# Patient Record
Sex: Male | Born: 1975
Health system: Southern US, Community
[De-identification: ages and names within clinical notes are randomized; demographics above are authoritative.]

## PROBLEM LIST (undated history)

## (undated) DIAGNOSIS — E669 Obesity, unspecified: Secondary | ICD-10-CM

## (undated) DIAGNOSIS — E291 Testicular hypofunction: Secondary | ICD-10-CM

## (undated) DIAGNOSIS — T7840XA Allergy, unspecified, initial encounter: Secondary | ICD-10-CM

## (undated) DIAGNOSIS — N529 Male erectile dysfunction, unspecified: Secondary | ICD-10-CM

## (undated) DIAGNOSIS — K219 Gastro-esophageal reflux disease without esophagitis: Secondary | ICD-10-CM

## (undated) DIAGNOSIS — M779 Enthesopathy, unspecified: Secondary | ICD-10-CM

## (undated) DIAGNOSIS — E876 Hypokalemia: Secondary | ICD-10-CM

## (undated) DIAGNOSIS — M1990 Unspecified osteoarthritis, unspecified site: Secondary | ICD-10-CM

## (undated) DIAGNOSIS — Z87891 Personal history of nicotine dependence: Secondary | ICD-10-CM

## (undated) DIAGNOSIS — G709 Myoneural disorder, unspecified: Secondary | ICD-10-CM

## (undated) DIAGNOSIS — E079 Disorder of thyroid, unspecified: Secondary | ICD-10-CM

## (undated) DIAGNOSIS — F419 Anxiety disorder, unspecified: Secondary | ICD-10-CM

## (undated) DIAGNOSIS — J45909 Unspecified asthma, uncomplicated: Secondary | ICD-10-CM

## (undated) HISTORY — DX: Unspecified osteoarthritis, unspecified site: M19.90

## (undated) HISTORY — DX: Testicular hypofunction: E29.1

## (undated) HISTORY — PX: REPLACEMENT TOTAL KNEE: SUR1224

## (undated) HISTORY — DX: Unspecified asthma, uncomplicated: J45.909

## (undated) HISTORY — PX: WISDOM TOOTH EXTRACTION: SHX21

## (undated) HISTORY — DX: Male erectile dysfunction, unspecified: N52.9

## (undated) HISTORY — PX: KNEE ARTHROSCOPY: SUR90

## (undated) HISTORY — DX: Enthesopathy, unspecified: M77.9

## (undated) HISTORY — DX: Disorder of thyroid, unspecified: E07.9

## (undated) HISTORY — DX: Myoneural disorder, unspecified: G70.9

## (undated) HISTORY — DX: Personal history of nicotine dependence: Z87.891

## (undated) HISTORY — PX: CERVICAL FUSION: SHX112

## (undated) HISTORY — DX: Gastro-esophageal reflux disease without esophagitis: K21.9

## (undated) HISTORY — DX: Obesity, unspecified: E66.9

## (undated) HISTORY — DX: Allergy, unspecified, initial encounter: T78.40XA

## (undated) HISTORY — PX: OSTEOTOMY MAXILLARY: SUR982

## (undated) HISTORY — PX: OTHER SURGICAL HISTORY: SHX169

## (undated) HISTORY — DX: Anxiety disorder, unspecified: F41.9

## (undated) HISTORY — DX: Hypokalemia: E87.6

## (undated) NOTE — Procedures (Signed)
 Associated Order(s): NERVE CONDUCTION STUDIES, 3-4 STUDIES Procedure(s): NERVE CONDUCTION STUDIES, 3-4 STUDIES Pre-Procedure Diagnose(s): PERIPHERAL NEUROPATHY Post-Procedure Diagnose(s): PERIPHERAL NEUROPATHY Formatting of this note might be different from the original. See notes Electronically signed by Raynaldo Evalene Mt (M.D.), M.D. at 08/29/2016  7:57 AM PST

## (undated) NOTE — Progress Notes (Signed)
 Formatting of this note might be different from the original. History: NEUROLOGY OUTPATIENT CONSULTATION  Consultation Requested by: Veronica Oliva Purchase (M.D.)   Chief Complaint: foot pain  TRAVELLE MCCLIMANS is a 59 year old right handed male developed severe bilateral foot pain circa 2009. Onset was fairly abrupt. Severe pain both feet, aggravated by standing. Told that he had a polyneuropathy. Mother also diagnosed with polyneuropathy. severe pain on plantar feet eventually resolved, but feet still burn, although largely relieved by Lyrica  75 mg BID to TID. Intolerant of gabapentin (at dose of 3600 mg daily). Treated for depression with SSRIs. Complains of erectile dysfunction; Viagra helps, but decreased libido. Lost 100 pounds with dieting and intense exercise. Denies weakness - in fact, he is a competitive weight lifter, denies orthostastic lightheadedness. Bowel and bladder function normal. No loss of sensation in perineum. Denies Lhermitte sign. Currently no sciatica symptoms, lower back pain or symptoms of neurogenic claudication. Status post 3 lumbar epidural steroid injections 2012 for sciatica that affected the left lower extremity. He has not had actual sensory loss in the feet. The patient has no history of diabetes mellitus.  Outpatient Prescriptions Marked as Taking for the 08/28/16 encounter (Procedure Only) with Raynaldo, Evalene Mt (M.D.), M.D.: ALPRAZolam (XANAX) 1 mg Oral Tab Pregabalin  (LYRICA ) 75 mg Oral Cap tiZANidine (ZANAFLEX) 4 mg Oral Tab Naproxen (NAPROSYN) 500 mg Oral Tab FLUoxetine (PROZAC) 40 mg Oral Cap traZODone (DESYREL) 50 mg Oral Tab Potassium Chloride (KLOR-CON 8) 8 mEq Oral SR Tab Cetirizine  (ZYRTEC ) 10 mg Oral Tab Levothyroxine  (LEVOTHROID/SYNTHROID ) 25 mcg Oral Tab    EXAM:  BP 132/67   Pulse 65   Ht 1.778 m (5' 10)   Wt 146.5 kg (322 lb 15.6 oz)   BMI 46.34 kg/m   Appears Healthy in NAD Alert, Oriented X 3  Straight leg raising  negative bilaterally   Pedal pulses intact  Tinel's sign absent at both tarsal tunnels  There is normal motor tone, bulk and strength throughout  Temperature sensation was decreased on the toes of both feet; touch and pin prick sensation were normal. Joint position sensation was normal at the great toes. Vibratory sensation was slightly decreased at the ankles.  The ankle jerks are absent bilaterally. Muscle stretch reflexes are otherwise normal and symmetrical throughout   Balance and gait are normal     History Reviewed:  I have reviewed the Medical/Surgical, Family history as displayed in [facility] on the date of the encounter or the portion(s) as noted in the progress note.        ELECTRODIAGNOSTIC TESTING:  NERVE CONDUCTION STUDIES - Lower Extremity  Sensory:  Nerve  Site/distance  Amplitude (mcV)  Latency (msec)  R sural Ankle 14 cm   NR (> 0)   NR  (< 4.5)  R sup per Ankle 14 cm   6 (> 0)   4.0  (< 4.5)  Motor  Nerve  Site/distance       Amp (mV)  Lat (msec) CV (m/sec)   R peroneal Ankle 9 cm    10.2  (>2.0) 4.6 (< 6.5)    Below fib     9.5    48  (>40)   R tibial Ankle 10 cm    13.0  (> 2.8) 4.1 (< 6.0)    popliteal     12.5    49   (>40)  Interpretation: normal nerve conduction studies; a small fiber polyneuropathy cannot be excluded  ASSESSMENT:  PERIPHERAL NEUROPATHY  (primary  encounter diagnosis) Note: non-progressive symptoms. Possible Guillain Barre' Syndrome variant. Bilateral L5/S1 radiculopathies could have a similar appearance. Complete work up. Continue low dose Lyrica  for pain. I doubt that the polyneuropathy is a significant contributing factor to the symptoms of erectile dysfunction. More likely depression and side effects from SSRIs. Patient has been advised to call for results.. Follow up in 6 months  Plan: VITAMIN B12 (COBALAMIN)           RHEUMATOID FACTOR, SERUM           IMMUNOGLOBULINS G A M           ANCA TITER AND PATTERN            IMMUNOFIXATION ELECTROPHORESIS, SERUM.           ANA           SS-A AND SS-B ANTIBODIES           BORRELIA BURGDORFERI ANTIBODY           METHYLMALONIC ACID  Electronically signed by: EVALENE MT ARMSTRONG MD 08/29/2016 7:55 AM      Electronically signed by Raynaldo Evalene Mt (M.D.), M.D. at 08/29/2016  7:57 AM PST

## (undated) NOTE — Nursing Note (Signed)
 Formatting of this note might be different from the original. Care Gaps  Update BMI - Take Height AND Weight  Electronically signed by Lenon Channel, M.A. at 08/28/2016  3:12 PM PST

---

## 2000-08-14 ENCOUNTER — Encounter: Admission: RE | Admit: 2000-08-14 | Discharge: 2000-08-14 | Payer: Self-pay | Admitting: Internal Medicine

## 2000-08-14 ENCOUNTER — Encounter: Payer: Self-pay | Admitting: Internal Medicine

## 2017-10-01 ENCOUNTER — Ambulatory Visit (INDEPENDENT_AMBULATORY_CARE_PROVIDER_SITE_OTHER): Payer: Managed Care, Other (non HMO) | Admitting: Physician Assistant

## 2017-10-01 ENCOUNTER — Encounter: Payer: Self-pay | Admitting: Physician Assistant

## 2017-10-01 ENCOUNTER — Encounter (INDEPENDENT_AMBULATORY_CARE_PROVIDER_SITE_OTHER): Payer: Self-pay

## 2017-10-01 ENCOUNTER — Ambulatory Visit: Payer: Self-pay | Admitting: Physician Assistant

## 2017-10-01 VITALS — BP 115/77 | HR 66 | Ht 70.0 in | Wt 293.0 lb

## 2017-10-01 DIAGNOSIS — E291 Testicular hypofunction: Secondary | ICD-10-CM

## 2017-10-01 DIAGNOSIS — E039 Hypothyroidism, unspecified: Secondary | ICD-10-CM | POA: Insufficient documentation

## 2017-10-01 DIAGNOSIS — M778 Other enthesopathies, not elsewhere classified: Secondary | ICD-10-CM

## 2017-10-01 DIAGNOSIS — E038 Other specified hypothyroidism: Secondary | ICD-10-CM

## 2017-10-01 DIAGNOSIS — M779 Enthesopathy, unspecified: Secondary | ICD-10-CM | POA: Diagnosis not present

## 2017-10-01 DIAGNOSIS — G609 Hereditary and idiopathic neuropathy, unspecified: Secondary | ICD-10-CM | POA: Insufficient documentation

## 2017-10-01 DIAGNOSIS — Z7689 Persons encountering health services in other specified circumstances: Secondary | ICD-10-CM

## 2017-10-01 DIAGNOSIS — E876 Hypokalemia: Secondary | ICD-10-CM | POA: Insufficient documentation

## 2017-10-01 DIAGNOSIS — M67922 Unspecified disorder of synovium and tendon, left upper arm: Secondary | ICD-10-CM | POA: Insufficient documentation

## 2017-10-01 DIAGNOSIS — M19022 Primary osteoarthritis, left elbow: Secondary | ICD-10-CM | POA: Insufficient documentation

## 2017-10-01 MED ORDER — PREGABALIN 75 MG PO CAPS: 75.0000 mg | ORAL_CAPSULE | Freq: Three times a day (TID) | ORAL | 0 refills | Status: DC | PRN

## 2017-10-01 NOTE — Progress Notes (Signed)
HPI:                                                                Garrett Watson is a 42 y.o. male who presents to [place]: [place] today to establish care  Current concerns include: medication refills, elbow pain  Hypothyroidism: diagnosed in 2010 with subclinical hypothyroidism. Takes synthroid 25 mcg daily without issues. Denies chest pain, heart palpitations, tremor, GI symptoms, or skin changes.  Elbow pain: Left lateral elbow pain for years. Worse with external rotation. Has intermittent numbness in his 4th and 5th digits. Takes Aleve prn and wears a tennis elbow strap.   Peripheral neuropathy: reports idiopathic neuropathy of both feet beginning in 2012, diagnosed by Dr. Tasia Catchings, Neurologist at [facility]. Reports intermittent peripheral edema and sensory differences in his lower extremities. He has labs with him on his phone today showing negative ANA, RF, negative pituitary MRI, negative immunofixation, normal B12, normal cortisol, normal LH, normal A1c   Hypogonadism: 1 reading <200, 1 reading low 300's. Has never been on testosterone replacement.  No flowsheet data found.  No flowsheet data found.      Past Medical History:  Diagnosis Date  . Hypokalemia   . Thyroid disease    Past Surgical History:  Procedure Laterality Date  . KNEE ARTHROSCOPY Right    2002, 2018  . OSTEOTOMY MAXILLARY     Social History   Tobacco Use  . Smoking status: Former Smoker    Types: Cigarettes    Last attempt to quit: 08/12/2015    Years since quitting: 2.1  . Smokeless tobacco: Never Used  Substance Use Topics  . Alcohol use: Never    Frequency: Never   family history includes Alcohol abuse in his brother, brother, and father; Depression in his cousin; Diabetes in his cousin, paternal aunt, and paternal uncle; Hyperlipidemia in his father; Hypertension in his father; Kidney cancer in his maternal grandmother; Lung cancer in his  maternal grandfather; Stroke in his father and paternal aunt.    ROS: negative except as noted in the HPI  Medications: Current Outpatient Medications  Medication Sig Dispense Refill  . B Complex Vitamins (B COMPLEX 100 PO) Take by mouth.    . fexofenadine (ALLEGRA) 180 MG tablet Take 180 mg by mouth daily.    Nyoka Cowden Tea 150 MG CAPS Take by mouth.    . Lactobacillus-Inulin ([place] PO) Take by mouth.    . levothyroxine (SYNTHROID, LEVOTHROID) 25 MCG tablet Take 1 tablet (25 mcg total) by mouth daily before breakfast. 90 tablet 1  . Multiple Vitamins-Minerals (CENTRUM ADULTS PO) Take by mouth.    . NONFORMULARY OR COMPOUNDED ITEM B-NOX Pre-workout formula    . Omega-3 Fatty Acids (FISH OIL) 1000 MG CAPS Take by mouth.    . pregabalin (LYRICA) 75 MG capsule Take 1 capsule (75 mg total) by mouth 3 (three) times daily as needed. 90 capsule 0   No current facility-administered medications for this visit.    Allergies  Allergen Reactions  . Robitussin 12 Hour Cough [Dextromethorphan Polistirex Er]        Objective:  BP 115/77   Pulse 66   Ht 5\' 10"  (1.778 m)   Wt 293 lb (132.9 kg)  BMI 42.04 kg/m  Gen:  alert, not ill-appearing, no distress, appropriate for age, obese male HEENT: head normocephalic without obvious abnormality, conjunctiva and cornea clear, trachea midline Pulm: Normal work of breathing, normal phonation, clear to auscultation bilaterally, no wheezes, rales or rhonchi CV: Normal rate, regular rhythm, s1 and s2 distinct, no murmurs, clicks or rubs  Neuro: alert and oriented x 3, no tremor MSK: left elbow atraumatic, wearing an elbow strap, no tenderness over the medial or lateral epicondyles, tenderness over the extensor tendons, ROM and strength intact Skin: intact, no rashes on exposed skin, no jaundice, no cyanosis Psych: well-groomed, cooperative, good eye contact, euthymic mood, affect mood-congruent, speech is articulate, and thought  processes clear and goal-directed   No results found.    Assessment and Plan: 42 y.o. male with   1. Encounter to establish care - Personally reviewed PMH, PSH, PFH, medications, allergies, HM - Age-appropriate cancer screening: n/a - Influenza n/a - Tdap declined today   2. Hypokalemia due to inadequate potassium intake - Comprehensive metabolic panel  3. Subclinical hypothyroidism - doing well on Synthroid 25 mcg - TSH + free T4  4. Idiopathic peripheral neuropathy - patient will send records from Neurologist at [facility] [place] [facility]. I reviewed his history with Dr. Dianah Field and he will follow-up with Sports Medicine for this. Refilled Lyrica, coupon card provided.  6. Forearm tendonitis - rehab exercises, declines prescription NSAID, cont Aleve prn. Follow-up with Sports Medicine  Encounter to establish care  Hypokalemia due to inadequate potassium intake - Plan: Comprehensive metabolic panel  Subclinical hypothyroidism - Plan: TSH + free T4, levothyroxine (SYNTHROID, LEVOTHROID) 25 MCG tablet  Idiopathic peripheral neuropathy - Plan: pregabalin (LYRICA) 75 MG capsule  Hypercalcemia - Plan: PTH, intact and calcium  Forearm tendonitis  Secondary male hypogonadism     Patient education and anticipatory guidance given Patient agrees with treatment plan Follow-up in 2 weeks with sports medicine for peripheral neuropathy and tendonitis or sooner as needed if symptoms worsen or fail to improve  Darlyne Russian PA-C

## 2017-10-02 LAB — COMPREHENSIVE METABOLIC PANEL
AG Ratio: 1.9 (calc) (ref 1.0–2.5)
ALT: 19 U/L (ref 9–46)
AST: 21 U/L (ref 10–40)
Albumin: 4.8 g/dL (ref 3.6–5.1)
Alkaline phosphatase (APISO): 46 U/L (ref 40–115)
BUN: 24 mg/dL (ref 7–25)
CHLORIDE: 103 mmol/L (ref 98–110)
CO2: 31 mmol/L (ref 20–32)
Calcium: 10.4 mg/dL — ABNORMAL HIGH (ref 8.6–10.3)
Creat: 1.04 mg/dL (ref 0.60–1.35)
GLOBULIN: 2.5 g/dL (ref 1.9–3.7)
GLUCOSE: 78 mg/dL (ref 65–139)
Potassium: 4.1 mmol/L (ref 3.5–5.3)
SODIUM: 140 mmol/L (ref 135–146)
TOTAL PROTEIN: 7.3 g/dL (ref 6.1–8.1)
Total Bilirubin: 1 mg/dL (ref 0.2–1.2)

## 2017-10-02 LAB — TSH+FREE T4: TSH W/REFLEX TO FT4: 2.4 mIU/L (ref 0.40–4.50)

## 2017-10-03 ENCOUNTER — Encounter: Payer: Self-pay | Admitting: Physician Assistant

## 2017-10-03 DIAGNOSIS — M779 Enthesopathy, unspecified: Secondary | ICD-10-CM

## 2017-10-03 DIAGNOSIS — E291 Testicular hypofunction: Secondary | ICD-10-CM | POA: Insufficient documentation

## 2017-10-03 DIAGNOSIS — M778 Other enthesopathies, not elsewhere classified: Secondary | ICD-10-CM | POA: Insufficient documentation

## 2017-10-03 MED ORDER — LEVOTHYROXINE SODIUM 25 MCG PO TABS: 25.0000 ug | ORAL_TABLET | Freq: Every day | ORAL | 1 refills | Status: DC

## 2017-10-03 NOTE — Progress Notes (Signed)
Potassium is normal. Do not recommend oral potassium due to risk of hyperkalemia and potentially fatal heart arrhythmias. Continue working on adequate dietary potassium. Calcium is mildly increased. Recheck in 1 month

## 2017-10-15 ENCOUNTER — Ambulatory Visit: Payer: Managed Care, Other (non HMO) | Admitting: Sports Medicine

## 2017-10-15 DIAGNOSIS — Z0189 Encounter for other specified special examinations: Secondary | ICD-10-CM

## 2017-10-18 ENCOUNTER — Ambulatory Visit (INDEPENDENT_AMBULATORY_CARE_PROVIDER_SITE_OTHER): Payer: Managed Care, Other (non HMO) | Admitting: Sports Medicine

## 2017-10-18 ENCOUNTER — Encounter: Payer: Self-pay | Admitting: Sports Medicine

## 2017-10-18 DIAGNOSIS — G609 Hereditary and idiopathic neuropathy, unspecified: Secondary | ICD-10-CM | POA: Diagnosis not present

## 2017-10-18 DIAGNOSIS — M67922 Unspecified disorder of synovium and tendon, left upper arm: Secondary | ICD-10-CM

## 2017-10-18 NOTE — Assessment & Plan Note (Signed)
NSAIDs as needed, rehab exercises given, if no improvement in 2 to 3 weeks we will do a distal biceps injection.

## 2017-10-18 NOTE — Assessment & Plan Note (Signed)
Normal lab work-up, rheumatologic work-up. Nerve conduction/EMG shows small fiber polyneuropathy versus radiculopathy. Does have a history of lumbar degenerative disc disease, we need an MRI. He has already had x-rays in [place] that were unremarkable. Continue Lyrica for now.

## 2017-10-18 NOTE — Progress Notes (Signed)
Subjective:    I'm seeing this patient as a consultation for: Nelson Chimes, PA-C  CC: Peripheral neuropathy, left elbow pain  HPI: Left elbow pain: Present for a year now, localized over the anterolateral aspect of the left elbow, no radiation, worse with gripping motions.  Patient suspected this was a tennis elbow, purchased a standard tennis elbow brace, has not had much improvement in symptoms.  Neuropathy: Has had a fairly extensive work-up in [place], including a full serum work-up with vitamin B12 levels, A1c, ANA, rheumatoid testing, testing for multiple myeloma.  All of which was negative, he had a nerve conduction study/EMG, this showed possible small nerve polyneuropathy, versus lumbar radiculopathy, otherwise normal.  His symptoms were initially controlled with gabapentin which she had difficulty tolerating, he was switched to Lyrica which seems to control his symptoms well.  He does have a history of lumbar degenerative disc disease and had been getting epidural sometime prior.  Does not have much back pain now, but more paresthesias to the lateral feet.  I reviewed the past medical history, family history, social history, surgical history, and allergies today and no changes were needed.  Please see the problem list section below in epic for further details.  Past Medical History: Past Medical History:  Diagnosis Date  . Hypokalemia   . Thyroid disease    Past Surgical History: Past Surgical History:  Procedure Laterality Date  . KNEE ARTHROSCOPY Right    2002, 2018  . OSTEOTOMY MAXILLARY     Social History: Social History   Socioeconomic History  . Marital status: Single    Spouse name: Not on file  . Number of children: Not on file  . Years of education: Not on file  . Highest education level: Not on file  Occupational History  . Not on file  Social Needs  . Financial resource strain: Not on file  . Food insecurity:    Worry: Not on file    Inability:  Not on file  . Transportation needs:    Medical: Not on file    Non-medical: Not on file  Tobacco Use  . Smoking status: Former Smoker    Types: Cigarettes    Last attempt to quit: 08/12/2015    Years since quitting: 2.1  . Smokeless tobacco: Never Used  Substance and Sexual Activity  . Alcohol use: Never    Frequency: Never  . Drug use: Yes    Types: Marijuana  . Sexual activity: Not Currently  Lifestyle  . Physical activity:    Days per week: Not on file    Minutes per session: Not on file  . Stress: Not on file  Relationships  . Social connections:    Talks on phone: Not on file    Gets together: Not on file    Attends religious service: Not on file    Active member of club or organization: Not on file    Attends meetings of clubs or organizations: Not on file    Relationship status: Not on file  Other Topics Concern  . Not on file  Social History Narrative  . Not on file   Family History: Family History  Problem Relation Age of Onset  . Alcohol abuse Father   . Hyperlipidemia Father   . Hypertension Father   . Stroke Father   . Alcohol abuse Brother   . Stroke Paternal Aunt   . Diabetes Paternal Aunt   . Diabetes Paternal Uncle   . Kidney cancer  Maternal Grandmother   . Lung cancer Maternal Grandfather   . Alcohol abuse Brother   . Diabetes Cousin   . Depression Cousin    Allergies: Allergies  Allergen Reactions  . Robitussin 12 Hour Cough [Dextromethorphan Polistirex Er]    Medications: See med rec.  Review of Systems: No headache, visual changes, nausea, vomiting, diarrhea, constipation, dizziness, abdominal pain, skin rash, fevers, chills, night sweats, weight loss, swollen lymph nodes, body aches, joint swelling, muscle aches, chest pain, shortness of breath, mood changes, visual or auditory hallucinations.   Objective:   General: Well Developed, well nourished, and in no acute distress.  Neuro:  Extra-ocular muscles intact, able to move all 4  extremities, sensation grossly intact.  Deep tendon reflexes tested were normal. Psych: Alert and oriented, mood congruent with affect. ENT:  Ears and nose appear unremarkable.  Hearing grossly normal. Neck: Unremarkable overall appearance, trachea midline.  No visible thyroid enlargement. Eyes: Conjunctivae and lids appear unremarkable.  Pupils equal and round. Skin: Warm and dry, no rashes noted.  Cardiovascular: Pulses palpable, no extremity edema. Back Exam:  Inspection: Unremarkable  Motion: Flexion 45 deg, Extension 45 deg, Side Bending to 45 deg bilaterally,  Rotation to 45 deg bilaterally  SLR laying: Negative  XSLR laying: Negative  Palpable tenderness: None. FABER: negative. Sensory change: Gross sensation intact to all lumbar and sacral dermatomes.  Reflexes: 2+ at both patellar tendons, 0 at achilles tendons, Babinski's downgoing.  Strength at foot  Plantar-flexion: 5/5 Dorsi-flexion: 5/5 Eversion: 5/5 Inversion: 5/5  Leg strength  Quad: 5/5 Hamstring: 5/5 Hip flexor: 5/5 Hip abductors: 5/5  Gait unremarkable. Left elbow: Unremarkable to inspection. Range of motion full pronation, supination, flexion, extension. Strength is full to all of the above directions Stable to varus, valgus stress. Negative moving valgus stress test. Only mild palpable pain over the anterior proximal radius, reproduction of pain with resisted supination of the forearm. Ulnar nerve does not sublux. Negative cubital tunnel Tinel's.  Impression and Recommendations:   This case required medical decision making of moderate complexity.  Left distal biceps tendinopathy NSAIDs as needed, rehab exercises given, if no improvement in 2 to 3 weeks we will do a distal biceps injection.  Idiopathic peripheral neuropathy Normal lab work-up, rheumatologic work-up. Nerve conduction/EMG shows small fiber polyneuropathy versus radiculopathy. Does have a history of lumbar degenerative disc disease, we need an  MRI. He has already had x-rays in [place] that were unremarkable. Continue Lyrica for now. ___________________________________________ Gwen Her. Dianah Field, M.D., ABFM., CAQSM. Primary Care and [place] Instructor of [place] [facility]

## 2017-11-11 ENCOUNTER — Telehealth: Payer: Self-pay | Admitting: Physician Assistant

## 2017-11-11 DIAGNOSIS — M5136 Other intervertebral disc degeneration, lumbar region: Secondary | ICD-10-CM

## 2017-11-11 DIAGNOSIS — M51369 Other intervertebral disc degeneration, lumbar region without mention of lumbar back pain or lower extremity pain: Secondary | ICD-10-CM

## 2017-11-11 NOTE — Telephone Encounter (Signed)
Pt called to see what the next steps were since his MRI was denied. Do not see where we have any xrays on file. Will route.

## 2017-11-12 NOTE — Telephone Encounter (Signed)
Do you have record of the xrays? They had requested clinical information and there was no imaging to attach to notes aside from mentioning their report in last OV note.

## 2017-11-12 NOTE — Telephone Encounter (Signed)
No unfortunately this is based off of his report.  I'll place the orders for xrays, have him come in for that and then maybe we try to resubmit?

## 2017-11-12 NOTE — Addendum Note (Signed)
Addended by: Silverio Decamp on: 11/12/2017 01:25 PM   Modules accepted: Orders

## 2017-11-12 NOTE — Telephone Encounter (Signed)
Pt advised. He will get repeat xrays next week then we can resubmit.

## 2017-11-12 NOTE — Telephone Encounter (Signed)
He already had x-rays in [place] that were unremarkable, I am not sure why they are denying the MRI.  May be try to resubmit it with the diagnosis of lumbar degenerative disc disease?  He had x-rays and has failed physical therapy, steroids, NSAIDs.

## 2017-11-17 ENCOUNTER — Other Ambulatory Visit: Payer: Self-pay | Admitting: *Deleted

## 2017-11-17 DIAGNOSIS — G609 Hereditary and idiopathic neuropathy, unspecified: Secondary | ICD-10-CM

## 2017-11-17 MED ORDER — PREGABALIN 75 MG PO CAPS: 75.0000 mg | ORAL_CAPSULE | Freq: Three times a day (TID) | ORAL | 0 refills | Status: DC | PRN

## 2017-11-22 ENCOUNTER — Ambulatory Visit: Payer: Managed Care, Other (non HMO) | Admitting: Sports Medicine

## 2017-11-25 ENCOUNTER — Ambulatory Visit (INDEPENDENT_AMBULATORY_CARE_PROVIDER_SITE_OTHER): Payer: Managed Care, Other (non HMO)

## 2017-11-25 ENCOUNTER — Ambulatory Visit (INDEPENDENT_AMBULATORY_CARE_PROVIDER_SITE_OTHER): Payer: Managed Care, Other (non HMO) | Admitting: Sports Medicine

## 2017-11-25 ENCOUNTER — Encounter: Payer: Self-pay | Admitting: Sports Medicine

## 2017-11-25 DIAGNOSIS — M5116 Intervertebral disc disorders with radiculopathy, lumbar region: Secondary | ICD-10-CM | POA: Diagnosis not present

## 2017-11-25 DIAGNOSIS — G609 Hereditary and idiopathic neuropathy, unspecified: Secondary | ICD-10-CM | POA: Diagnosis not present

## 2017-11-25 DIAGNOSIS — M67922 Unspecified disorder of synovium and tendon, left upper arm: Secondary | ICD-10-CM

## 2017-11-25 DIAGNOSIS — M5136 Other intervertebral disc degeneration, lumbar region: Secondary | ICD-10-CM

## 2017-11-25 NOTE — Assessment & Plan Note (Signed)
Normal lab work-up, rheumatologic work-up, nerve conduction/EMG shows small fiber polyneuropathy versus radiculopathy. Does have a history of lumbar degenerative disc disease, MRI was initially denied by the insurance company. Recently did x-rays today, L5 on S1 retrolisthesis. Continue Lyrica, resubmitting for the MRI. Please use a diagnosis of radiculopathy.

## 2017-11-25 NOTE — Assessment & Plan Note (Signed)
Persistent pain in spite of conservative measures. Distal biceps peritendinous injection, return to see me in 1 month for this.

## 2017-11-25 NOTE — Progress Notes (Signed)
Subjective:    CC: Elbow pain  HPI: Izaias returns, we treated him for a left bicipital tendinitis distal, last visit, he has done his rehab exercises, NSAIDs, activity modification without any improvement in his pain that continues to be localized over the anterior aspect of his elbow at the proximal radius.  Worsened with flexion and supination activities.  In addition we have been working to diagnose his lower extremity paresthesias, nerve conduction study showed possible radiculopathy, we initially ordered an MRI this was denied, he obtained his x-ray today, symptoms are persistent.  I reviewed the past medical history, family history, social history, surgical history, and allergies today and no changes were needed.  Please see the problem list section below in epic for further details.  Past Medical History: Past Medical History:  Diagnosis Date  . Hypokalemia   . Thyroid disease    Past Surgical History: Past Surgical History:  Procedure Laterality Date  . KNEE ARTHROSCOPY Right    2002, 2018  . OSTEOTOMY MAXILLARY     Social History: Social History   Socioeconomic History  . Marital status: Single    Spouse name: Not on file  . Number of children: Not on file  . Years of education: Not on file  . Highest education level: Not on file  Occupational History  . Not on file  Social Needs  . Financial resource strain: Not on file  . Food insecurity:    Worry: Not on file    Inability: Not on file  . Transportation needs:    Medical: Not on file    Non-medical: Not on file  Tobacco Use  . Smoking status: Former Smoker    Types: Cigarettes    Last attempt to quit: 08/12/2015    Years since quitting: 2.2  . Smokeless tobacco: Never Used  Substance and Sexual Activity  . Alcohol use: Never    Frequency: Never  . Drug use: Yes    Types: Marijuana  . Sexual activity: Not Currently  Lifestyle  . Physical activity:    Days per week: Not on file    Minutes per  session: Not on file  . Stress: Not on file  Relationships  . Social connections:    Talks on phone: Not on file    Gets together: Not on file    Attends religious service: Not on file    Active member of club or organization: Not on file    Attends meetings of clubs or organizations: Not on file    Relationship status: Not on file  Other Topics Concern  . Not on file  Social History Narrative  . Not on file   Family History: Family History  Problem Relation Age of Onset  . Alcohol abuse Father   . Hyperlipidemia Father   . Hypertension Father   . Stroke Father   . Alcohol abuse Brother   . Stroke Paternal Aunt   . Diabetes Paternal Aunt   . Diabetes Paternal Uncle   . Kidney cancer Maternal Grandmother   . Lung cancer Maternal Grandfather   . Alcohol abuse Brother   . Diabetes Cousin   . Depression Cousin    Allergies: Allergies  Allergen Reactions  . Robitussin 12 Hour Cough [Dextromethorphan Polistirex Er]    Medications: See med rec.  Review of Systems: No fevers, chills, night sweats, weight loss, chest pain, or shortness of breath.   Objective:    General: Well Developed, well nourished, and in no acute distress.  Neuro: Alert and oriented x3, extra-ocular muscles intact, sensation grossly intact.  HEENT: Normocephalic, atraumatic, pupils equal round reactive to light, neck supple, no masses, no lymphadenopathy, thyroid nonpalpable.  Skin: Warm and dry, no rashes. Cardiac: Regular rate and rhythm, no murmurs rubs or gallops, no lower extremity edema.  Respiratory: Clear to auscultation bilaterally. Not using accessory muscles, speaking in full sentences. Left Elbow: Unremarkable to inspection. Range of motion full pronation, supination, flexion, extension. Strength is full to all of the above directions Stable to varus, valgus stress. Negative moving valgus stress test. Tender to palpation over the anterior proximal radius, reproduction of pain with  resisted supination of the forearm Ulnar nerve does not sublux. Negative cubital tunnel Tinel's.  Procedure: Real-time Ultrasound Guided Injection of left distal biceps tendon Device: GE Logiq E  Verbal informed consent obtained.  Time-out conducted.  Noted no overlying erythema, induration, or other signs of local infection.  Skin prepped in a sterile fashion.  Local anesthesia: Topical Ethyl chloride.  With sterile technique and under real time ultrasound guidance: The elbow was brought to flexion and the forearm in pronation, I visualized the biceps insertion from a dorsal approach at the radial tuberosity, I then advanced a 25-gauge needle to the biceps insertion and injected medication superficial to the tendon, taking care to avoid intratendinous injection, we used a total of 1 cc kenalog 40, 1 cc lidocaine, 1 cc bupivacaine. Completed without difficulty  Pain immediately resolved suggesting accurate placement of the medication.  Advised to call if fevers/chills, erythema, induration, drainage, or persistent bleeding.  Images permanently stored and available for review in the ultrasound unit.  Impression: Technically successful ultrasound guided injection.  Lumbar spine x-rays personally reviewed, there is retrolisthesis of L5 on S1.  Impression and Recommendations:    Left distal biceps tendinopathy Persistent pain in spite of conservative measures. Distal biceps peritendinous injection, return to see me in 1 month for this.  Idiopathic peripheral neuropathy Normal lab work-up, rheumatologic work-up, nerve conduction/EMG shows small fiber polyneuropathy versus radiculopathy. Does have a history of lumbar degenerative disc disease, MRI was initially denied by the insurance company. Recently did x-rays today, L5 on S1 retrolisthesis. Continue Lyrica, resubmitting for the MRI. Please use a diagnosis of radiculopathy.  ___________________________________________ Gwen Her.  Dianah Field, M.D., ABFM., CAQSM. Primary Care and [place] Instructor of [place] [facility]

## 2017-12-27 ENCOUNTER — Ambulatory Visit (INDEPENDENT_AMBULATORY_CARE_PROVIDER_SITE_OTHER): Payer: Managed Care, Other (non HMO) | Admitting: Sports Medicine

## 2017-12-27 ENCOUNTER — Ambulatory Visit (INDEPENDENT_AMBULATORY_CARE_PROVIDER_SITE_OTHER): Payer: Managed Care, Other (non HMO)

## 2017-12-27 ENCOUNTER — Encounter: Payer: Self-pay | Admitting: Sports Medicine

## 2017-12-27 DIAGNOSIS — M19022 Primary osteoarthritis, left elbow: Secondary | ICD-10-CM

## 2017-12-27 DIAGNOSIS — M67922 Unspecified disorder of synovium and tendon, left upper arm: Secondary | ICD-10-CM

## 2017-12-27 DIAGNOSIS — G609 Hereditary and idiopathic neuropathy, unspecified: Secondary | ICD-10-CM | POA: Diagnosis not present

## 2017-12-27 NOTE — Assessment & Plan Note (Signed)
Normal lab work-up, normal rheumatologic work-up, nerve conduction/EMG shows small fiber polyneuropathy versus radiculopathy. More recent MRI showed L4-L5 degenerative disc disease with spinal stenosis, proceeding with a bilateral L4-L5 transforaminal epidural. Continue Lyrica, return to see me 1 month after injection. He will go and get an MRI burned to a disc before his injection.

## 2017-12-27 NOTE — Assessment & Plan Note (Signed)
80% improvement with distal biceps injection. Persistent pain in the posterior elbow, worse with hyperextension concerning for intra-articular process. At this point we are going to proceed with x-ray and an MRI, he has failed greater than 6 weeks of physician directed conservative measures including physical therapy and injections.

## 2017-12-27 NOTE — Progress Notes (Signed)
Subjective:    CC: Recheck elbow  HPI: Garrett Watson returns, we did a left distal biceps tendon sheath injection at the last visit, he had about 80% improvement in pain with regards to his anterolateral elbow, still has some pain in the posterior elbow.  At this point he is failed greater than 6 weeks of physician directed conservative measures including NSAIDs, therapy, injections.  In addition we have been working up his leg paresthesias, MRI was recently done after a nerve conduction study showed small fiber polyneuropathy versus radiculopathy.  I reviewed the past medical history, family history, social history, surgical history, and allergies today and no changes were needed.  Please see the problem list section below in epic for further details.  Past Medical History: Past Medical History:  Diagnosis Date  . Hypokalemia   . Thyroid disease    Past Surgical History: Past Surgical History:  Procedure Laterality Date  . KNEE ARTHROSCOPY Right    2002, 2018  . OSTEOTOMY MAXILLARY     Social History: Social History   Socioeconomic History  . Marital status: Single    Spouse name: Not on file  . Number of children: Not on file  . Years of education: Not on file  . Highest education level: Not on file  Occupational History  . Not on file  Social Needs  . Financial resource strain: Not on file  . Food insecurity:    Worry: Not on file    Inability: Not on file  . Transportation needs:    Medical: Not on file    Non-medical: Not on file  Tobacco Use  . Smoking status: Former Smoker    Types: Cigarettes    Last attempt to quit: 08/12/2015    Years since quitting: 2.3  . Smokeless tobacco: Never Used  Substance and Sexual Activity  . Alcohol use: Never    Frequency: Never  . Drug use: Yes    Types: Marijuana  . Sexual activity: Not Currently  Lifestyle  . Physical activity:    Days per week: Not on file    Minutes per session: Not on file  . Stress: Not on file    Relationships  . Social connections:    Talks on phone: Not on file    Gets together: Not on file    Attends religious service: Not on file    Active member of club or organization: Not on file    Attends meetings of clubs or organizations: Not on file    Relationship status: Not on file  Other Topics Concern  . Not on file  Social History Narrative  . Not on file   Family History: Family History  Problem Relation Age of Onset  . Alcohol abuse Father   . Hyperlipidemia Father   . Hypertension Father   . Stroke Father   . Alcohol abuse Brother   . Stroke Paternal Aunt   . Diabetes Paternal Aunt   . Diabetes Paternal Uncle   . Kidney cancer Maternal Grandmother   . Lung cancer Maternal Grandfather   . Alcohol abuse Brother   . Diabetes Cousin   . Depression Cousin    Allergies: Allergies  Allergen Reactions  . Robitussin 12 Hour Cough [Dextromethorphan Polistirex Er]    Medications: See med rec.  Review of Systems: No fevers, chills, night sweats, weight loss, chest pain, or shortness of breath.   Objective:    General: Well Developed, well nourished, and in no acute distress.  Neuro: Alert  and oriented x3, extra-ocular muscles intact, sensation grossly intact.  HEENT: Normocephalic, atraumatic, pupils equal round reactive to light, neck supple, no masses, no lymphadenopathy, thyroid nonpalpable.  Skin: Warm and dry, no rashes. Cardiac: Regular rate and rhythm, no murmurs rubs or gallops, no lower extremity edema.  Respiratory: Clear to auscultation bilaterally. Not using accessory muscles, speaking in full sentences. Left elbow: Unremarkable to inspection. Range of motion full pronation, supination, flexion, extension, pain with terminal extension. Strength is full to all of the above directions Stable to varus, valgus stress. Negative moving valgus stress test. No discrete areas of tenderness to palpation. Ulnar nerve does not sublux. Negative cubital tunnel  Tinel's.  MRI shows multifactorial central canal stenosis at L4-L5 with mild bilateral foraminal stenosis at this level.  Impression and Recommendations:    Left distal biceps tendinopathy 80% improvement with distal biceps injection. Persistent pain in the posterior elbow, worse with hyperextension concerning for intra-articular process. At this point we are going to proceed with x-ray and an MRI, he has failed greater than 6 weeks of physician directed conservative measures including physical therapy and injections. ___________________________________________ Gwen Her. Dianah Field, M.D., ABFM., CAQSM. Primary Care and [place] Instructor of [place] [facility]

## 2018-01-05 ENCOUNTER — Other Ambulatory Visit: Payer: Self-pay

## 2018-01-05 DIAGNOSIS — G609 Hereditary and idiopathic neuropathy, unspecified: Secondary | ICD-10-CM

## 2018-01-05 MED ORDER — PREGABALIN 75 MG PO CAPS: 75.0000 mg | ORAL_CAPSULE | Freq: Three times a day (TID) | ORAL | 0 refills | Status: DC | PRN

## 2018-01-24 ENCOUNTER — Other Ambulatory Visit: Payer: Self-pay | Admitting: Sports Medicine

## 2018-01-24 ENCOUNTER — Ambulatory Visit
Admission: RE | Admit: 2018-01-24 | Discharge: 2018-01-24 | Disposition: A | Discharge status: Home / Self Care | Payer: Managed Care, Other (non HMO) | Source: Ambulatory Visit | Attending: Sports Medicine | Admitting: Sports Medicine

## 2018-01-24 DIAGNOSIS — G609 Hereditary and idiopathic neuropathy, unspecified: Secondary | ICD-10-CM

## 2018-01-24 MED ORDER — IOPAMIDOL (ISOVUE-M 200) INJECTION 41%
1.0000 mL | Freq: Once | INTRAMUSCULAR | Status: AC
  Administered 2018-01-24: 1 mL via EPIDURAL

## 2018-01-24 MED ORDER — METHYLPREDNISOLONE ACETATE 40 MG/ML INJ SUSP (RADIOLOG
120.0000 mg | Freq: Once | INTRAMUSCULAR | Status: AC
  Administered 2018-01-24: 120 mg via EPIDURAL

## 2018-01-24 NOTE — Discharge Instructions (Signed)

## 2018-02-24 ENCOUNTER — Other Ambulatory Visit: Payer: Self-pay

## 2018-02-24 DIAGNOSIS — G609 Hereditary and idiopathic neuropathy, unspecified: Secondary | ICD-10-CM

## 2018-02-24 MED ORDER — PREGABALIN 75 MG PO CAPS: 75.0000 mg | ORAL_CAPSULE | Freq: Three times a day (TID) | ORAL | 0 refills | Status: DC | PRN

## 2018-04-04 ENCOUNTER — Other Ambulatory Visit: Payer: Self-pay | Admitting: Physician Assistant

## 2018-04-04 DIAGNOSIS — G609 Hereditary and idiopathic neuropathy, unspecified: Secondary | ICD-10-CM

## 2018-04-15 ENCOUNTER — Other Ambulatory Visit: Payer: Self-pay | Admitting: Physician Assistant

## 2018-04-15 DIAGNOSIS — E038 Other specified hypothyroidism: Secondary | ICD-10-CM

## 2018-04-15 DIAGNOSIS — E039 Hypothyroidism, unspecified: Secondary | ICD-10-CM

## 2018-05-09 ENCOUNTER — Other Ambulatory Visit: Payer: Self-pay | Admitting: Physician Assistant

## 2018-05-09 DIAGNOSIS — G609 Hereditary and idiopathic neuropathy, unspecified: Secondary | ICD-10-CM

## 2018-06-16 ENCOUNTER — Other Ambulatory Visit: Payer: Self-pay | Admitting: Physician Assistant

## 2018-06-16 DIAGNOSIS — G609 Hereditary and idiopathic neuropathy, unspecified: Secondary | ICD-10-CM

## 2018-06-16 NOTE — Telephone Encounter (Signed)
Needs to have labs drawn and follow-up appt Has not seen me in the office since April He is also seeing Dr. Darene Lamer, but since I am filling the Lyrica, I need to see him myself at least every 6 months

## 2018-06-30 ENCOUNTER — Other Ambulatory Visit: Payer: Self-pay | Admitting: Physician Assistant

## 2018-06-30 DIAGNOSIS — G609 Hereditary and idiopathic neuropathy, unspecified: Secondary | ICD-10-CM

## 2018-07-05 ENCOUNTER — Encounter: Payer: Self-pay | Admitting: Physician Assistant

## 2018-07-05 ENCOUNTER — Ambulatory Visit (INDEPENDENT_AMBULATORY_CARE_PROVIDER_SITE_OTHER): Payer: BLUE CROSS/BLUE SHIELD | Admitting: Physician Assistant

## 2018-07-05 VITALS — BP 123/82 | HR 87 | Resp 16 | Wt 330.0 lb

## 2018-07-05 DIAGNOSIS — G609 Hereditary and idiopathic neuropathy, unspecified: Secondary | ICD-10-CM | POA: Diagnosis not present

## 2018-07-05 DIAGNOSIS — Z6841 Body Mass Index (BMI) 40.0 and over, adult: Secondary | ICD-10-CM

## 2018-07-05 DIAGNOSIS — Z13220 Encounter for screening for lipoid disorders: Secondary | ICD-10-CM

## 2018-07-05 DIAGNOSIS — E038 Other specified hypothyroidism: Secondary | ICD-10-CM

## 2018-07-05 DIAGNOSIS — J3089 Other allergic rhinitis: Secondary | ICD-10-CM

## 2018-07-05 DIAGNOSIS — E039 Hypothyroidism, unspecified: Secondary | ICD-10-CM

## 2018-07-05 DIAGNOSIS — R635 Abnormal weight gain: Secondary | ICD-10-CM | POA: Diagnosis not present

## 2018-07-05 DIAGNOSIS — E291 Testicular hypofunction: Secondary | ICD-10-CM

## 2018-07-05 MED ORDER — PREGABALIN 75 MG PO CAPS: 75.0000 mg | ORAL_CAPSULE | Freq: Three times a day (TID) | ORAL | 2 refills | Status: DC

## 2018-07-05 MED ORDER — MONTELUKAST SODIUM 10 MG PO TABS: 10.0000 mg | ORAL_TABLET | Freq: Every day | ORAL | 3 refills | Status: DC

## 2018-07-05 MED ORDER — FLUTICASONE PROPIONATE 50 MCG/ACT NA SUSP: 1.0000 | Freq: Every day | NASAL | 3 refills | Status: DC

## 2018-07-05 NOTE — Progress Notes (Signed)
HPI:                                                                Garrett Watson is a 43 y.o. male who presents to [facility] today for multiple concerns  Sinus issues: reports he is having monthly cold-like symptoms. He feels like he is sick every 4-6 weeks since July 2019.  He has been seen at outside urgent cares and had several prescriptions for Z-Paks over this time.  Reports antibiotics were not particularly helpful for symptoms. Taking Dayquil and Nyquil on a regular basis. He has a mild deviated septum, but no prior history of chronic or recurrent sinusitis. Has not had any symptoms for the last 2 weeks.  Endorses chronic passive smoke exposure at work for 10 hours/day. He quit smoking in 2017, reports he smoked approx 15 packyears. No fever, chills, nightsweats.  Denies cough, wheezing, dyspnea. Endorses history of environmental allergies.  Weight gain: has gained over 30 pounds since last OV 6 months ago.  Current weight is 330 pounds.  Highest adult weight was 405 pounds.  His typical adult weight is around 290 to 300 pounds. Has noticed increased fat in his abdominal area. States he tracks his food daily and eats 2700/day on work-out days and 2200 on non-work out day. Goes to the gym for 5 days per week, doing a mix of weight and cardio for 60 minutes.  Now at a job where he walks 12,000 steps per day, since November 2019. Prior weight loss attempts: traditional diet and exercise has been able to lose upwards of 100 lb, states he has always had difficulty losing weight and has to work very hard Comorbidities: subclinical hypothyroidism - has been on 25 mcg for 10-12 years; hypogonadism not currently on testosterone replacement, last testosterone levels in 2018 were between 179 and 327 (Care Everywhere)  He denies symptoms of hypogonadism including low libido, decreased energy, change in strength or endurance, loss of height,  mood changes, falling asleep after dinner.    Past Medical History:  Diagnosis Date  . Allergy   . Erectile dysfunction   . Former cigarette smoker   . Hypogonadism male   . Hypokalemia   . Obesity   . Thyroid disease    Past Surgical History:  Procedure Laterality Date  . KNEE ARTHROSCOPY Right    2002, 2018  . OSTEOTOMY MAXILLARY     Social History   Tobacco Use  . Smoking status: Former Smoker    Types: Cigarettes    Last attempt to quit: 08/12/2015    Years since quitting: 2.8  . Smokeless tobacco: Never Used  Substance Use Topics  . Alcohol use: Never    Frequency: Never   family history includes Alcohol abuse in his brother, brother, and father; Depression in his cousin; Diabetes in his cousin, paternal aunt, and paternal uncle; Hyperlipidemia in his father; Hypertension in his father; Kidney cancer in his maternal grandmother; Lung cancer in his maternal grandfather; Stroke in his father and paternal aunt.    ROS: negative except as noted in the HPI  Medications: Current Outpatient Medications  Medication Sig Dispense Refill  . B Complex Vitamins (B COMPLEX 100 PO) Take by mouth.    Marland Kitchen  Cetirizine HCl (ZYRTEC PO) Take by mouth.    Nyoka Cowden Tea 150 MG CAPS Take by mouth.    . Lactobacillus-Inulin ([place] PO) Take by mouth.    . levothyroxine (SYNTHROID, LEVOTHROID) 25 MCG tablet TAKE 1 TABLET DAILY BEFORE BREAKFAST 90 tablet 1  . Multiple Vitamins-Minerals (CENTRUM ADULTS PO) Take by mouth.    . NONFORMULARY OR COMPOUNDED ITEM B-NOX Pre-workout formula    . Omega-3 Fatty Acids (FISH OIL) 1000 MG CAPS Take by mouth.    . pregabalin (LYRICA) 75 MG capsule Take 1 capsule (75 mg total) by mouth 3 (three) times daily. 90 capsule 2  . fluticasone (FLONASE) 50 MCG/ACT nasal spray Place 1 spray into both nostrils daily. 16 g 3  . montelukast (SINGULAIR) 10 MG tablet Take 1 tablet (10 mg total) by mouth at bedtime. 90 tablet 3   No current  facility-administered medications for this visit.    Allergies  Allergen Reactions  . Robitussin 12 Hour Cough [Dextromethorphan Polistirex Er]        Objective:  BP 123/82   Pulse 87   Resp 16   Wt (!) 330 lb (149.7 kg)   SpO2 97%   BMI 47.35 kg/m  Gen:  alert, not ill-appearing, no distress, appropriate for age, obese male HEENT: head normocephalic without obvious abnormality, conjunctiva and cornea clear, deviated nasal septum, nares patent, no frontal or maxillary sinus tenderness, oropharynx with white postnasal secretions, tonsils between grade 2 and 3 with mild redness, no exudates, uvula midline, neck supple, no cervical adenopathy trachea midline Pulm: Normal work of breathing, normal phonation, clear to auscultation bilaterally, no wheezes, rales or rhonchi CV: Normal rate, regular rhythm, s1 and s2 distinct, no murmurs, clicks or rubs  Neuro: alert and oriented x 3, no tremor MSK: extremities atraumatic, normal gait and station, trace peripheral edema bilaterally Skin: intact, no rashes on exposed skin, no jaundice, no cyanosis Psych: well-groomed, cooperative, good eye contact, euthymic mood, affect mood-congruent, speech is articulate, and thought processes clear and goal-directed  Wt Readings from Last 3 Encounters:  07/05/18 (!) 330 lb (149.7 kg)  12/27/17 298 lb (135.2 kg)  11/25/17 (!) 302 lb (137 kg)      No results found for this or any previous visit (from the past 72 hour(s)). No results found.    Assessment and Plan: 43 y.o. male with   .Diagnoses and all orders for this visit:  Abnormal weight gain -     Hemoglobin A1c -     COMPLETE METABOLIC PANEL WITH GFR -     TSH + free T4  Idiopathic peripheral neuropathy -     pregabalin (LYRICA) 75 MG capsule; Take 1 capsule (75 mg total) by mouth 3 (three) times daily.  Subclinical hypothyroidism -     TSH + free T4  Hypercalcemia -     PTH, Intact and Calcium  Class 3 severe obesity due to  excess calories with serious comorbidity and body mass index (BMI) of 45.0 to 49.9 in adult [facility])  Secondary male hypogonadism -     Testosterone  Non-seasonal allergic rhinitis, unspecified trigger -     montelukast (SINGULAIR) 10 MG tablet; Take 1 tablet (10 mg total) by mouth at bedtime. -     fluticasone (FLONASE) 50 MCG/ACT nasal spray; Place 1 spray into both nostrils daily.  Screening for lipid disorders -     Lipid Panel w/reflex Direct LDL     Recurrent cold symptoms I believe are  actually due to environmental allergies based on his history and physical exam today.  Treating with antihistamine, leukotriene antagonist, intranasal corticosteroid.  Class III obesity Checking labs today including A1c, TSH, CMP, lipids due to 32 pound weight gain.  Also rechecking testosterone level. Based on his self-reported caloric intake he should be losing between 1 and 2 pounds per week Would recommend keeping calorie count to no more than 2500/day on days that he exercises.  It is possible he is adding back more than he is burning at the gym since estimates of calories burned are not typically accurate. Provided with information on pharmacologic options and instructed to contact his insurance company regarding coverage for weight loss medication.   Patient education and anticipatory guidance given Patient agrees with treatment plan Follow-up in 6 weeks or sooner as needed if symptoms worsen or fail to improve  Darlyne Russian PA-C

## 2018-07-06 LAB — COMPLETE METABOLIC PANEL WITH GFR
AG RATIO: 1.7 (calc) (ref 1.0–2.5)
ALBUMIN MSPROF: 4.3 g/dL (ref 3.6–5.1)
ALKALINE PHOSPHATASE (APISO): 44 U/L (ref 40–115)
ALT: 20 U/L (ref 9–46)
AST: 22 U/L (ref 10–40)
BILIRUBIN TOTAL: 0.7 mg/dL (ref 0.2–1.2)
BUN: 16 mg/dL (ref 7–25)
CHLORIDE: 103 mmol/L (ref 98–110)
CO2: 26 mmol/L (ref 20–32)
Calcium: 9.8 mg/dL (ref 8.6–10.3)
Creat: 0.89 mg/dL (ref 0.60–1.35)
GFR, Est African American: 123 mL/min/{1.73_m2} (ref >=60)
GFR, Est Non African American: 106 mL/min/{1.73_m2} (ref >=60)
GLOBULIN: 2.6 g/dL (ref 1.9–3.7)
GLUCOSE: 87 mg/dL (ref 65–99)
POTASSIUM: 4.3 mmol/L (ref 3.5–5.3)
SODIUM: 140 mmol/L (ref 135–146)
Total Protein: 6.9 g/dL (ref 6.1–8.1)

## 2018-07-06 LAB — HEMOGLOBIN A1C
Hgb A1c MFr Bld: 5.2 % of total Hgb (ref <5.7)
MEAN PLASMA GLUCOSE: 103 (calc)
eAG (mmol/L): 5.7 (calc)

## 2018-07-06 LAB — TSH+FREE T4: TSH W/REFLEX TO FT4: 1.74 mIU/L (ref 0.40–4.50)

## 2018-07-06 LAB — TESTOSTERONE: Testosterone: 231 ng/dL — ABNORMAL LOW (ref 250–827)

## 2018-07-06 LAB — LIPID PANEL W/REFLEX DIRECT LDL
Cholesterol: 197 mg/dL (ref <200)
HDL: 43 mg/dL (ref >40)
LDL Cholesterol (Calc): 134 mg/dL (calc) — ABNORMAL HIGH
Non-HDL Cholesterol (Calc): 154 mg/dL (calc) — ABNORMAL HIGH (ref <130)
Total CHOL/HDL Ratio: 4.6 (calc) (ref <5.0)
Triglycerides: 102 mg/dL (ref <150)

## 2018-07-06 LAB — PTH, INTACT AND CALCIUM
Calcium: 9.8 mg/dL (ref 8.6–10.3)
PTH: 15 pg/mL (ref 14–64)

## 2018-07-14 ENCOUNTER — Telehealth: Payer: Self-pay

## 2018-07-14 ENCOUNTER — Other Ambulatory Visit: Payer: Self-pay

## 2018-07-14 DIAGNOSIS — E038 Other specified hypothyroidism: Secondary | ICD-10-CM

## 2018-07-14 DIAGNOSIS — E66813 Obesity, class 3: Secondary | ICD-10-CM

## 2018-07-14 DIAGNOSIS — R635 Abnormal weight gain: Secondary | ICD-10-CM

## 2018-07-14 DIAGNOSIS — E039 Hypothyroidism, unspecified: Secondary | ICD-10-CM

## 2018-07-14 DIAGNOSIS — Z6841 Body Mass Index (BMI) 40.0 and over, adult: Principal | ICD-10-CM

## 2018-07-14 MED ORDER — LEVOTHYROXINE SODIUM 25 MCG PO TABS: 25.0000 ug | ORAL_TABLET | Freq: Every day | ORAL | 0 refills | Status: DC

## 2018-07-14 NOTE — Telephone Encounter (Signed)
Pt left vm stating that he would like to try Qsymia.  He is wanting this sent to [facility]. -EH/RMA

## 2018-07-15 MED ORDER — PHENTERMINE-TOPIRAMATE ER 7.5-46 MG PO CP24: 1.0000 | ORAL_CAPSULE | Freq: Every morning | ORAL | 0 refills | Status: DC

## 2018-07-15 MED ORDER — PHENTERMINE-TOPIRAMATE ER 3.75-23 MG PO CP24: 1.0000 | ORAL_CAPSULE | Freq: Every morning | ORAL | 0 refills | Status: DC

## 2018-07-15 NOTE — Telephone Encounter (Signed)
2 prescriptions sent The lower dose should be taken for 2 weeks. This is to introduce the drug to the body and observe for any side effects He should monitor his pulse and BP at home while taking this medication If he has any palpitations, irregular heart beats, chest pain or breathing difficulties medication should be stopped. Follow-up in office in 6 weeks. Bring BP/pulse readings  [facility]sym[facility] also has a mail order pharmacy that charges a flat fee of around $100/month for these medications. Option to do this if [facility] is too expensive

## 2018-07-15 NOTE — Telephone Encounter (Signed)
Pt notified -EH/RMA  

## 2018-08-31 ENCOUNTER — Ambulatory Visit (INDEPENDENT_AMBULATORY_CARE_PROVIDER_SITE_OTHER): Payer: BLUE CROSS/BLUE SHIELD

## 2018-08-31 ENCOUNTER — Other Ambulatory Visit: Payer: Self-pay

## 2018-08-31 ENCOUNTER — Encounter: Payer: Self-pay | Admitting: Physician Assistant

## 2018-08-31 ENCOUNTER — Ambulatory Visit (INDEPENDENT_AMBULATORY_CARE_PROVIDER_SITE_OTHER): Payer: BLUE CROSS/BLUE SHIELD | Admitting: Physician Assistant

## 2018-08-31 VITALS — BP 114/74 | HR 86 | Resp 14 | Wt 322.0 lb

## 2018-08-31 DIAGNOSIS — Z6841 Body Mass Index (BMI) 40.0 and over, adult: Secondary | ICD-10-CM | POA: Diagnosis not present

## 2018-08-31 DIAGNOSIS — R05 Cough: Secondary | ICD-10-CM

## 2018-08-31 DIAGNOSIS — R058 Other specified cough: Secondary | ICD-10-CM | POA: Insufficient documentation

## 2018-08-31 MED ORDER — BUDESONIDE-FORMOTEROL FUMARATE 160-4.5 MCG/ACT IN AERO: 2.0000 | INHALATION_SPRAY | Freq: Two times a day (BID) | RESPIRATORY_TRACT | 0 refills | Status: DC

## 2018-08-31 MED ORDER — TOPIRAMATE 25 MG PO TABS: 25.0000 mg | ORAL_TABLET | Freq: Two times a day (BID) | ORAL | 1 refills | Status: DC

## 2018-08-31 NOTE — Progress Notes (Signed)
HPI:                                                                Garrett Watson is a 43 y.o. male who presents to [facility] [place] today for cough and weight check  07/15/2018 Sinus issues: reports he is having monthly cold-like symptoms. He feels like he is sick every 4-6 weeks since July 2019.  He has been seen at outside urgent cares and had several prescriptions for Z-Paks over this time.  Reports antibiotics were not particularly helpful for symptoms. Taking Dayquil and Nyquil on a regular basis. He has a mild deviated septum, but no prior history of chronic or recurrent sinusitis. Has not had any symptoms for the last 2 weeks.  Endorses chronic passive smoke exposure at work for 10 hours/day. He quit smoking in 2017, reports he smoked approx 15 packyears. No fever, chills, nightsweats.  Denies cough, wheezing, dyspnea. Endorses history of environmental allergies. 08/31/2018 - treated aggressively for environmental allergies with antihistamine, leukotriene antagonist, intranasal corticosteroid; has not noticed much of an improvement in his symptoms. Doesn't feel like the Flonase is getting inside his nose - reports he is having a productive cough for several weeks. Denies chest pain, wheezing, shortness of breath. Still feeling a lot of mucus in his throat  07/15/2018 Weight gain: has gained over 30 pounds since last OV 6 months ago.  Current weight is 330 pounds.  Highest adult weight was 405 pounds.  His typical adult weight is around 290 to 300 pounds. Has noticed increased fat in his abdominal area. States he tracks his food daily and eats 2700/day on work-out days and 2200 on non-work out day. Goes to the gym for 5 days per week, doing a mix of weight and cardio for 60 minutes.  Now at a job where he walks 12,000 steps per day, since November 2019. Prior weight loss attempts: traditional diet and exercise has been able to lose upwards  of 100 lb, states he has always had difficulty losing weight and has to work very hard Comorbidities: subclinical hypothyroidism - has been on 25 mcg for 10-12 years; hypogonadism not currently on testosterone replacement, last testosterone levels in 2018 were between 179 and 327 (Care Everywhere) 08/31/2018 Wt Readings from Last 3 Encounters:  08/31/18 (!) 322 lb (146.1 kg)  07/05/18 (!) 330 lb (149.7 kg)  12/27/17 298 lb (135.2 kg)  - labs including TSH, lipids, A1C and CMP were unremarkable. - Testosterone level was decreased consistent with secondary hypogonadism. He is asymptomatic - he has lost 8 pounds in approx 8 weeks since starting Qsymia - he has noticed increased irritability with the medication as well as flushing and palpitations. He would like to discontinue it - reports he has made some changes to his diet and    Past Medical History:  Diagnosis Date  . Allergy   . Erectile dysfunction   . Former cigarette smoker   . Hypogonadism male   . Hypokalemia   . Obesity   . Thyroid disease    Past Surgical History:  Procedure Laterality Date  . KNEE ARTHROSCOPY Right    2002, 2018  . OSTEOTOMY MAXILLARY     Social History   Tobacco Use  .  Smoking status: Former Smoker    Types: Cigarettes    Last attempt to quit: 08/12/2015    Years since quitting: 3.0  . Smokeless tobacco: Never Used  Substance Use Topics  . Alcohol use: Never    Frequency: Never   family history includes Alcohol abuse in his brother, brother, and father; Depression in his cousin; Diabetes in his cousin, paternal aunt, and paternal uncle; Hyperlipidemia in his father; Hypertension in his father; Kidney cancer in his maternal grandmother; Lung cancer in his maternal grandfather; Stroke in his father and paternal aunt.    ROS: negative except as noted in the HPI  Medications: Current Outpatient Medications  Medication Sig Dispense Refill  . B Complex Vitamins (B COMPLEX 100 PO) Take by mouth.     . budesonide-formoterol (SYMBICORT) 160-4.5 MCG/ACT inhaler Inhale 2 puffs into the lungs 2 (two) times daily. 1 Inhaler 0  . Cetirizine HCl (ZYRTEC PO) Take by mouth.    . fluticasone (FLONASE) 50 MCG/ACT nasal spray Place 1 spray into both nostrils daily. 16 g 3  . Green Tea 150 MG CAPS Take by mouth.    . Lactobacillus-Inulin (Sublimity PO) Take by mouth.    . levothyroxine (SYNTHROID, LEVOTHROID) 25 MCG tablet Take 1 tablet (25 mcg total) by mouth daily before breakfast. 90 tablet 0  . montelukast (SINGULAIR) 10 MG tablet Take 1 tablet (10 mg total) by mouth at bedtime. 90 tablet 3  . Multiple Vitamins-Minerals (CENTRUM ADULTS PO) Take by mouth.    . NONFORMULARY OR COMPOUNDED ITEM B-NOX Pre-workout formula    . Omega-3 Fatty Acids (FISH OIL) 1000 MG CAPS Take by mouth.    . pregabalin (LYRICA) 75 MG capsule Take 1 capsule (75 mg total) by mouth 3 (three) times daily. 90 capsule 2  . topiramate (TOPAMAX) 25 MG tablet Take 1 tablet (25 mg total) by mouth 2 (two) times daily. 60 tablet 1   No current facility-administered medications for this visit.    Allergies  Allergen Reactions  . Robitussin 12 Hour Cough [Dextromethorphan Polistirex Er]   . Phentermine Other (See Comments)    Irritability, increased heart rate       Objective:  BP 114/74   Pulse 86   Resp 14   Wt (!) 322 lb (146.1 kg)   SpO2 99%   BMI 46.20 kg/m  Gen:  alert, not ill-appearing, no distress, appropriate for age, obese male HEENT: head normocephalic without obvious abnormality, conjunctiva and cornea clear, trachea midline Pulm: Normal work of breathing, normal phonation, clear to auscultation bilaterally, no wheezes, rales or rhonchi CV: Normal rate, regular rhythm, s1 and s2 distinct, no murmurs, clicks or rubs  Neuro: alert and oriented x 3, no tremor MSK: extremities atraumatic, normal gait and station, trace peripheral edema bilaterally Skin: intact, no rashes on exposed skin, no  jaundice, no cyanosis Psych: well-groomed, cooperative, good eye contact, euthymic mood, affect mood-congruent, speech is articulate, and thought processes clear and goal-directed  Wt Readings from Last 3 Encounters:  08/31/18 (!) 322 lb (146.1 kg)  07/05/18 (!) 330 lb (149.7 kg)  12/27/17 298 lb (135.2 kg)   Lab Results  Component Value Date   CREATININE 0.89 07/05/2018   BUN 16 07/05/2018   NA 140 07/05/2018   K 4.3 07/05/2018   CL 103 07/05/2018   CO2 26 07/05/2018   Lab Results  Component Value Date   ALT 20 07/05/2018   AST 22 07/05/2018   BILITOT 0.7 07/05/2018  No results found for: TSH, T3TOTAL, T4TOTAL, THYROIDAB Lab Results  Component Value Date   HGBA1C 5.2 07/05/2018   Lab Results  Component Value Date   CHOL 197 07/05/2018   HDL 43 07/05/2018   LDLCALC 134 (H) 07/05/2018   TRIG 102 07/05/2018   CHOLHDL 4.6 07/05/2018   The 10-year ASCVD risk score Mikey Bussing DC Jr., et al., 2013) is: 1.4%   Values used to calculate the score:     Age: 37 years     Sex: Male     Is Non-Hispanic African American: No     Diabetic: No     Tobacco smoker: No     Systolic Blood Pressure: 568 mmHg     Is BP treated: No     HDL Cholesterol: 43 mg/dL     Total Cholesterol: 197 mg/dL    Assessment and Plan: 43 y.o. male with   .Press was seen today for weight check.  Diagnoses and all orders for this visit:  Recurrent productive cough -     DG Chest 2 View -     budesonide-formoterol (SYMBICORT) 160-4.5 MCG/ACT inhaler; Inhale 2 puffs into the lungs 2 (two) times daily.  Class 3 severe obesity due to excess calories with serious comorbidity and body mass index (BMI) of 45.0 to 49.9 in adult (HCC) -     topiramate (TOPAMAX) 25 MG tablet; Take 1 tablet (25 mg total) by mouth 2 (two) times daily.     Recurrent cough:  Afebrile, no tachypnea, no tachycardia, pulse ox 99% on RA at rest, no adventitious lung sounds Did not have a significant response to  antihistamine, leukotriene antagonist, intranasal corticosteroid Suspect he may have chronic bronchitis from smoking history  Chest x-ray today Trial of Symbicort  Return in 1 month for spirometry  Class III obesity:   labs including TSH, lipids, A1C and CMP were unremarkable. D/c Qsymia due to adverse effects of Phentermine Switch to Topiramate 25 mg bid Applauded for losing 8 pounds Encouraged to continue following a calorie restricted diet and getting regular aerobic exercise   Patient education and anticipatory guidance given Patient agrees with treatment plan Follow-up in 4 weeks or sooner as needed if symptoms worsen or fail to improve  Darlyne Russian PA-C

## 2018-08-31 NOTE — Patient Instructions (Signed)
Chronic Bronchitis  Chronic bronchitis is long-lasting inflammation of the tubes that carry air into your lungs (bronchial tubes). This is inflammation that occurs:  · On most days of the week.  · For at least three months at a time.  · Over a period of two years in a row.  When the bronchial tubes are inflamed, they start to produce mucus. The inflammation and buildup of mucus make it more difficult to breathe. Chronic bronchitis is usually a permanent problem. It is one type of chronic obstructive pulmonary disease (COPD). People with chronic bronchitis are more likely to get frequent colds or respiratory infections.  What are the causes?  Chronic bronchitis most often occurs in people who:  · Have chronic, severe asthma.  · Have a history of smoking.  · Have asthma and smoke.  · Have certain lung diseases.  · Have had long-term exposure to certain irritating fumes or chemicals.  What are the signs or symptoms?  Symptoms of chronic bronchitis may include:  · A cough that brings up mucus (productive cough).  · Shortness of breath.  · Loud breathing (wheezing).  · Chest discomfort.  · Frequent (recurring) colds or respiratory infections.  Certain things can trigger chronic bronchitis symptoms or make them worse, such as:  · Infections.  · Stopping certain medicines.  · Smoking.  · Exposure to chemicals.  How is this diagnosed?  This condition may be diagnosed based on:  · Your symptoms and medical history.  · A physical exam.  · A chest X-ray.  · Lung (pulmonary) function tests.  How is this treated?  There is no cure for chronic bronchitis, but treatment can help control your symptoms. Treatment may include:  · Using a cool mist vaporizer or humidifier to make it easier to breathe.  · Drinking more fluids. Drinking more makes your mucus thinner, which may make it easier to breathe.  · Lifestyle changes, such as eating a healthier diet and getting more exercise.  · Medicines, such as:  ? Inhalers to improve air flow  in and out of your lungs.  ? Antibiotics to treat any bacterial infections you have, such as:  § Lung infection (pneumonia).  § Sinus infection.  § A sudden, severe (acute) episode of bronchitis.  · Oxygen therapy.  · Preventing infections by keeping up to date on vaccinations, including the pneumonia and flu vaccines.  · Pulmonary rehabilitation. This is a program that helps you manage your breathing problems and improve your quality of life. It may last for up to 4-12 weeks and may include exercise programs, education, counseling, and treatment support.  Follow these instructions at home:  Medicines  · Take over-the-counter and prescription medicines only as told by your health care provider.  · If you were prescribed an antibiotic medicine, take it as told by your health care provider. Do not stop taking the antibiotic even if you start to feel better.  Preventing infections  · Get vaccinations as told by your health care provider. Make sure you get a flu shot (influenza vaccine) every year.  · Wash your hands often with soap and water. If soap and water are not available, use hand sanitizer.  · Avoid contact with people who have symptoms of a cold or the flu.  Managing symptoms    · Do not smoke, and avoid secondhand smoke. Exposure to cigarette smoke or irritating chemicals will make bronchitis worse. If you smoke and you need help quitting, ask your health   care provider. Quitting smoking will help your lungs heal faster.  · Use an inhaler, cool mist vaporizer, or humidifier as told by your health care provider.  · Avoid pollen, dust, animal dander, molds, smoke, and other things that cause shortness of breath or wheezing attacks.  · Use oxygen therapy at home as directed. Follow instructions from your health care provider about how to use oxygen safely and take precautions to prevent fire. Make sure you never smoke while using oxygen or allow others to smoke in your home.  · Do not wait to get medical care if  you have any concerning symptoms or trouble breathing. Waiting could cause permanent injury and may be life threatening.  General instructions  · Talk with your health care provider about what activities are safe for you and about possible exercise routines. Regular exercise is very important to help you feel better.  · Drink enough fluids to keep your urine pale yellow.  · Keep all follow-up visits as told by your health care provider. This is important.  Contact a health care provider if:  · You have coughing or shortness of breath that gets worse.  · You have muscle aches.  · You have chest pain.  · Your mucus seems to get thicker.  · Your mucus changes from clear or white to yellow, green, gray, or bloody.  Get help right away if:  · Your usual medicines do not stop your wheezing.  · You have severe difficulty breathing.  These symptoms may represent a serious problem that is an emergency. Do not wait to see if the symptoms will go away. Get medical help right away. Call your local emergency services (911 in the [place] Do not drive yourself to the hospital.  Summary  · Chronic bronchitis is long-lasting inflammation of the tubes that carry air into your lungs (bronchial tubes).  · Chronic bronchitis is usually a permanent problem. It is one type of chronic obstructive pulmonary disease (COPD).  · There is no cure for chronic bronchitis, but treatment can help control your symptoms.  · Do not smoke, and avoid secondhand smoke. Exposure to cigarette smoke or irritating chemicals will make bronchitis worse.  This information is not intended to replace advice given to you by your health care provider. Make sure you discuss any questions you have with your health care provider.  Document Released: 03/26/2006 Document Revised: 04/28/2017 Document Reviewed: 04/28/2017  Elsevier Interactive Patient Education © 2019 [employer]

## 2018-09-05 ENCOUNTER — Other Ambulatory Visit: Payer: Self-pay | Admitting: Physician Assistant

## 2018-09-05 DIAGNOSIS — J3089 Other allergic rhinitis: Secondary | ICD-10-CM

## 2018-09-28 ENCOUNTER — Other Ambulatory Visit: Payer: BLUE CROSS/BLUE SHIELD

## 2018-10-03 ENCOUNTER — Other Ambulatory Visit: Payer: Self-pay

## 2018-10-03 ENCOUNTER — Other Ambulatory Visit: Payer: Self-pay | Admitting: Physician Assistant

## 2018-10-03 DIAGNOSIS — R05 Cough: Secondary | ICD-10-CM

## 2018-10-03 DIAGNOSIS — R058 Other specified cough: Secondary | ICD-10-CM

## 2018-10-03 MED ORDER — BUDESONIDE-FORMOTEROL FUMARATE 160-4.5 MCG/ACT IN AERO: 2.0000 | INHALATION_SPRAY | Freq: Two times a day (BID) | RESPIRATORY_TRACT | 0 refills | Status: DC

## 2018-10-06 ENCOUNTER — Other Ambulatory Visit: Payer: Self-pay | Admitting: Physician Assistant

## 2018-10-06 DIAGNOSIS — E038 Other specified hypothyroidism: Secondary | ICD-10-CM

## 2018-10-06 DIAGNOSIS — E039 Hypothyroidism, unspecified: Secondary | ICD-10-CM

## 2018-10-12 ENCOUNTER — Other Ambulatory Visit: Payer: Self-pay | Admitting: Physician Assistant

## 2018-10-12 DIAGNOSIS — G609 Hereditary and idiopathic neuropathy, unspecified: Secondary | ICD-10-CM

## 2018-10-25 ENCOUNTER — Other Ambulatory Visit: Payer: Self-pay | Admitting: Physician Assistant

## 2018-10-25 DIAGNOSIS — Z6841 Body Mass Index (BMI) 40.0 and over, adult: Principal | ICD-10-CM

## 2018-10-25 MED ORDER — TOPIRAMATE 25 MG PO TABS: 25.0000 mg | ORAL_TABLET | Freq: Two times a day (BID) | ORAL | 1 refills | Status: DC

## 2018-10-25 NOTE — Addendum Note (Signed)
Addended by: Narda Rutherford on: 10/25/2018 03:54 PM   Modules accepted: Orders

## 2018-10-27 ENCOUNTER — Other Ambulatory Visit: Payer: Self-pay | Admitting: Physician Assistant

## 2018-10-27 DIAGNOSIS — R058 Other specified cough: Secondary | ICD-10-CM

## 2018-10-27 DIAGNOSIS — R05 Cough: Secondary | ICD-10-CM

## 2018-11-21 ENCOUNTER — Other Ambulatory Visit: Payer: Self-pay | Admitting: Physician Assistant

## 2018-11-21 DIAGNOSIS — R05 Cough: Secondary | ICD-10-CM

## 2018-11-21 DIAGNOSIS — R058 Other specified cough: Secondary | ICD-10-CM

## 2018-11-21 DIAGNOSIS — Z6841 Body Mass Index (BMI) 40.0 and over, adult: Secondary | ICD-10-CM

## 2018-11-24 ENCOUNTER — Other Ambulatory Visit: Payer: Self-pay | Admitting: Physician Assistant

## 2018-11-24 DIAGNOSIS — R058 Other specified cough: Secondary | ICD-10-CM

## 2018-11-24 DIAGNOSIS — R05 Cough: Secondary | ICD-10-CM

## 2018-12-26 ENCOUNTER — Other Ambulatory Visit: Payer: Self-pay

## 2018-12-26 ENCOUNTER — Other Ambulatory Visit: Payer: Self-pay | Admitting: Physician Assistant

## 2018-12-26 DIAGNOSIS — R05 Cough: Secondary | ICD-10-CM

## 2018-12-26 DIAGNOSIS — R058 Other specified cough: Secondary | ICD-10-CM

## 2018-12-26 MED ORDER — TOPIRAMATE 25 MG PO TABS: 25.0000 mg | ORAL_TABLET | Freq: Two times a day (BID) | ORAL | 0 refills | Status: DC

## 2018-12-29 ENCOUNTER — Other Ambulatory Visit: Payer: Self-pay | Admitting: Physician Assistant

## 2018-12-29 DIAGNOSIS — E039 Hypothyroidism, unspecified: Secondary | ICD-10-CM

## 2018-12-29 DIAGNOSIS — E038 Other specified hypothyroidism: Secondary | ICD-10-CM

## 2019-01-23 ENCOUNTER — Other Ambulatory Visit: Payer: Self-pay | Admitting: Physician Assistant

## 2019-01-23 DIAGNOSIS — R05 Cough: Secondary | ICD-10-CM

## 2019-01-23 DIAGNOSIS — R058 Other specified cough: Secondary | ICD-10-CM

## 2019-01-27 ENCOUNTER — Other Ambulatory Visit: Payer: Self-pay | Admitting: Physician Assistant

## 2019-01-27 DIAGNOSIS — G609 Hereditary and idiopathic neuropathy, unspecified: Secondary | ICD-10-CM

## 2019-02-20 ENCOUNTER — Other Ambulatory Visit: Payer: Self-pay | Admitting: Physician Assistant

## 2019-02-20 DIAGNOSIS — R05 Cough: Secondary | ICD-10-CM

## 2019-02-20 DIAGNOSIS — R058 Other specified cough: Secondary | ICD-10-CM

## 2019-03-10 ENCOUNTER — Other Ambulatory Visit: Payer: Self-pay | Admitting: Physician Assistant

## 2019-03-10 DIAGNOSIS — G609 Hereditary and idiopathic neuropathy, unspecified: Secondary | ICD-10-CM

## 2019-03-27 ENCOUNTER — Other Ambulatory Visit: Payer: Self-pay | Admitting: Physician Assistant

## 2019-03-27 DIAGNOSIS — Z6841 Body Mass Index (BMI) 40.0 and over, adult: Secondary | ICD-10-CM

## 2019-03-27 DIAGNOSIS — R05 Cough: Secondary | ICD-10-CM

## 2019-03-27 DIAGNOSIS — R058 Other specified cough: Secondary | ICD-10-CM

## 2019-04-12 ENCOUNTER — Other Ambulatory Visit: Payer: Self-pay | Admitting: Physician Assistant

## 2019-04-12 DIAGNOSIS — G609 Hereditary and idiopathic neuropathy, unspecified: Secondary | ICD-10-CM

## 2019-04-13 NOTE — Telephone Encounter (Signed)
Last filled 03/10/2019 for one month with note that needs appt. Please fill 15 day refill.

## 2019-04-17 ENCOUNTER — Ambulatory Visit: Payer: BC Managed Care – PPO | Admitting: Osteopathic Medicine

## 2019-04-27 ENCOUNTER — Encounter: Payer: Self-pay | Admitting: Osteopathic Medicine

## 2019-04-27 ENCOUNTER — Ambulatory Visit (INDEPENDENT_AMBULATORY_CARE_PROVIDER_SITE_OTHER): Payer: BC Managed Care – PPO | Admitting: Osteopathic Medicine

## 2019-04-27 DIAGNOSIS — R058 Other specified cough: Secondary | ICD-10-CM

## 2019-04-27 DIAGNOSIS — E039 Hypothyroidism, unspecified: Secondary | ICD-10-CM | POA: Diagnosis not present

## 2019-04-27 DIAGNOSIS — J4520 Mild intermittent asthma, uncomplicated: Secondary | ICD-10-CM | POA: Diagnosis not present

## 2019-04-27 DIAGNOSIS — Z0000 Encounter for general adult medical examination without abnormal findings: Secondary | ICD-10-CM

## 2019-04-27 DIAGNOSIS — Z6841 Body Mass Index (BMI) 40.0 and over, adult: Secondary | ICD-10-CM

## 2019-04-27 DIAGNOSIS — G609 Hereditary and idiopathic neuropathy, unspecified: Secondary | ICD-10-CM

## 2019-04-27 DIAGNOSIS — J3089 Other allergic rhinitis: Secondary | ICD-10-CM

## 2019-04-27 DIAGNOSIS — R05 Cough: Secondary | ICD-10-CM

## 2019-04-27 DIAGNOSIS — E038 Other specified hypothyroidism: Secondary | ICD-10-CM

## 2019-04-27 MED ORDER — CETIRIZINE HCL 10 MG PO TABS: 10.0000 mg | ORAL_TABLET | Freq: Every day | ORAL | 2 refills | Status: DC

## 2019-04-27 MED ORDER — AZELASTINE-FLUTICASONE 137-50 MCG/ACT NA SUSP: NASAL | 11 refills | Status: DC

## 2019-04-27 MED ORDER — TOPIRAMATE 25 MG PO TABS: 25.0000 mg | ORAL_TABLET | Freq: Two times a day (BID) | ORAL | 1 refills | Status: DC

## 2019-04-27 MED ORDER — MONTELUKAST SODIUM 10 MG PO TABS: 10.0000 mg | ORAL_TABLET | Freq: Every day | ORAL | 3 refills | Status: DC

## 2019-04-27 MED ORDER — PREGABALIN 75 MG PO CAPS: 75.0000 mg | ORAL_CAPSULE | Freq: Three times a day (TID) | ORAL | 1 refills | Status: DC

## 2019-04-27 MED ORDER — LEVOTHYROXINE SODIUM 25 MCG PO TABS: 25.0000 ug | ORAL_TABLET | Freq: Every day | ORAL | 1 refills | Status: DC

## 2019-04-27 MED ORDER — FLUTICASONE PROPIONATE 50 MCG/ACT NA SUSP: 1.0000 | Freq: Every day | NASAL | 3 refills | Status: AC

## 2019-04-27 MED ORDER — SYMBICORT 160-4.5 MCG/ACT IN AERO: 2.0000 | INHALATION_SPRAY | Freq: Two times a day (BID) | RESPIRATORY_TRACT | 11 refills | Status: DC

## 2019-04-27 NOTE — Patient Instructions (Addendum)
  Weight loss: I refilled the topiramate/Topamax.  Okay to increase this from 25 mg twice daily to 50 mg / 2 pills twice daily and see if this works any better.  Please contact your insurance company and see if they might also cover the following weight loss medications: Saxenda, Ozempic, Contrave?   Allergies/asthma: I refilled the Symbicort, Singulair.  Okay to continue the Flonase but I also sent an alternative nasal spray azelastine-fluticasone which has a component of Flonase as well as an antihistamine in it.  This might help during severe allergy season.  During allergy season, can also try increasing Zyrtec from once to twice daily.  Let's plan to recheck for sure in January/February, you will be due for annual blood work around then anyway.  Lab orders are in place so you can get blood work ahead of our visit (good idea anyway in case we need to do virtual visits rather than in-office, depending on pandemic!) If you need me sooner than that, or you decide you want to try one of the above weight loss medications instead of (or in addition to) the Topamax, please let me know! Call call the office or see below for setting up MyChart to message [place]!    MyChart Ready to Activate Your MyChart Account? Your Activation Code is:  [email or web address] Expires: 06/11/2019  8:34 AM   Go to  [email or web address]  or download the MyChart app.   Go to the app store, search "MyChart", open the app, select [place], and activate your account.   Need technical help? Call [phone]. For Medical questions please contact your provider.

## 2019-04-27 NOTE — Progress Notes (Signed)
Virtual Visit via Video (App used: Doximity) Note  I connected with      Garrett Watson on 04/27/19 at 7:35 AM by a telemedicine application and verified that I am speaking with the correct person using two identifiers.  Patient is at home I am working from home    I discussed the limitations of evaluation and management by telemedicine and the availability of in person appointments. The patient expressed understanding and agreed to proceed.  History of Present Illness: Garrett Watson is a 43 y.o. male who would like to discuss  Chief Complaint  Patient presents with  . Transitions Of Care  . Medication Refill    Topamax    Medications are working well, overall he is feeling pretty good.   Weight: Previously on phentermine-topiramate and this caused significant blood pressure increase.  He has been just on topiramate since then.  Has been off the topiramate since he has been out for about a month.  Noting a bit more moodiness/irritability, weight has plateaued.  Asthma: symbicort, montelukast, flonase are working really well! Hasn't been sick like he used to get. Now occasionally having some sinus congestion/coughing.   L knee starting to give him trouble, he is thinking about following up with Dr. Darene Lamer.  Reports some left-sided lung/rib discomfort that comes and goes but seems to be getting better since he has been on vacation.  He has a history of pneumonia on that side so just wanted to mention this.  He denies fever, chills, cough, shortness of breath, chest pain   Observations/Objective: Temp 98.7 F (37.1 C) (Oral)   Wt (!) 316 lb (143.3 kg)   BMI 45.34 kg/m  BP Readings from Last 3 Encounters:  08/31/18 114/74  07/05/18 123/82  01/24/18 (!) 142/72   Exam: Normal Speech.  NAD  Lab and Radiology Results No results found for this or any previous visit (from the past 72 hour(s)). No results found.     Assessment and Plan: 43 y.o. male with  The primary encounter diagnosis was Class 3 severe obesity due to excess calories with serious comorbidity and body mass index (BMI) of 45.0 to 49.9 in adult [facility]). Diagnoses of Subclinical hypothyroidism, Mild intermittent asthma without complication, Non-seasonal allergic rhinitis, unspecified trigger, Idiopathic peripheral neuropathy, Recurrent productive cough, and Annual physical exam were also pertinent to this visit.   PDMP not reviewed this encounter. Orders Placed This Encounter  Procedures  . CBC  . COMPLETE METABOLIC PANEL WITH GFR  . LIPID SCREENING  . TSH   Meds ordered this encounter  Medications  . Azelastine-Fluticasone 137-50 MCG/ACT SUSP    Sig: One spray each nostril BID    Dispense:  23 g    Refill:  11  . topiramate (TOPAMAX) 25 MG tablet    Sig: Take 1-2 tablets (25-50 mg total) by mouth 2 (two) times daily.    Dispense:  180 tablet    Refill:  1  . cetirizine (ZYRTEC) 10 MG tablet    Sig: Take 1 tablet (10 mg total) by mouth daily. Can increase to twice daily during allergy season    Dispense:  180 tablet    Refill:  2  . fluticasone (FLONASE) 50 MCG/ACT nasal spray    Sig: Place 1 spray into both nostrils daily.    Dispense:  48 g    Refill:  3  . levothyroxine (SYNTHROID) 25 MCG tablet    Sig: Take 1 tablet (25 mcg total) by  mouth daily before breakfast.    Dispense:  90 tablet    Refill:  1  . montelukast (SINGULAIR) 10 MG tablet    Sig: Take 1 tablet (10 mg total) by mouth at bedtime.    Dispense:  90 tablet    Refill:  3  . pregabalin (LYRICA) 75 MG capsule    Sig: Take 1 capsule (75 mg total) by mouth 3 (three) times daily.    Dispense:  270 capsule    Refill:  1    Not to exceed 5 additional fills before 09/06/2019  . SYMBICORT 160-4.5 MCG/ACT inhaler    Sig: Inhale 2 puffs into the lungs 2 (two) times daily.    Dispense:  10.2 g    Refill:  11   Patient Instructions   Weight loss: I refilled the topiramate/Topamax.  Okay to increase  this from 25 mg twice daily to 50 mg / 2 pills twice daily and see if this works any better.  Please contact your insurance company and see if they might also cover the following weight loss medications: Saxenda, Ozempic, Contrave?   Allergies/asthma: I refilled the Symbicort, Singulair.  Okay to continue the Flonase but I also sent an alternative nasal spray azelastine-fluticasone which has a component of Flonase as well as an antihistamine in it.  This might help during severe allergy season.  During allergy season, can also try increasing Zyrtec from once to twice daily.  Let's plan to recheck for sure in January/February, you will be due for annual blood work around then anyway.  Lab orders are in place so you can get blood work ahead of our visit (good idea anyway in case we need to do virtual visits rather than in-office, depending on pandemic!) If you need me sooner than that, or you decide you want to try one of the above weight loss medications instead of (or in addition to) the Topamax, please let me know! Call call the office or see below for setting up MyChart to message [place]!    MyChart Ready to Activate Your MyChart Account? Your Activation Code is:  [email or web address] Expires: 06/11/2019  8:34 AM   Go to  [email or web address]  or download the MyChart app.   Go to the app store, search "MyChart", open the app, select [facility], and activate your account.   Need technical help? Call [phone]. For Medical questions please contact your provider.   Labs ordered for future visit. Annual physical / preventive care was NOT performed or billed today.   Instructions sent via MyChart. If MyChart not available, pt was given option for info via personal e-mail w/ no guarantee of protected health info over unsecured e-mail communication, and MyChart sign-up instructions were included.   Follow Up Instructions: Return in about 3 months (around 07/28/2019) for [place] (get  labs prior to visit, orders are in).    I discussed the assessment and treatment plan with the patient. The patient was provided an opportunity to ask questions and all were answered. The patient agreed with the plan and demonstrated an understanding of the instructions.   The patient was advised to call back or seek an in-person evaluation if any new concerns, if symptoms worsen or if the condition fails to improve as anticipated.  25 minutes of non-face-to-face time was provided during this encounter.                      Historical information moved to improve visibility of documentation.  Past Medical History:  Diagnosis Date  . Allergy   . Erectile dysfunction   . Former cigarette smoker   . Hypogonadism male   . Hypokalemia   . Obesity   . Thyroid disease    Past Surgical History:  Procedure Laterality Date  . KNEE ARTHROSCOPY Right    2002, 2018  . OSTEOTOMY MAXILLARY     Social History   Tobacco Use  . Smoking status: Former Smoker    Types: Cigarettes    Quit date: 08/12/2015    Years since quitting: 3.7  . Smokeless tobacco: Never Used  Substance Use Topics  . Alcohol use: Never    Frequency: Never   family history includes Alcohol abuse in his brother, brother, and father; Depression in his cousin; Diabetes in his cousin, paternal aunt, and paternal uncle; Hyperlipidemia in his father; Hypertension in his father; Kidney cancer in his maternal grandmother; Lung cancer in his maternal grandfather; Stroke in his father and paternal aunt.  Medications: Current Outpatient Medications  Medication Sig Dispense Refill  . B Complex Vitamins (B COMPLEX 100 PO) Take by mouth.    . fluticasone (FLONASE) 50 MCG/ACT nasal spray Place 1 spray into both nostrils daily. 48 g 3  . Green Tea 150 MG CAPS Take by mouth.    . Lactobacillus-Inulin ([place] PO) Take by mouth.    . levothyroxine (SYNTHROID) 25 MCG tablet Take 1 tablet (25 mcg  total) by mouth daily before breakfast. 90 tablet 1  . montelukast (SINGULAIR) 10 MG tablet Take 1 tablet (10 mg total) by mouth at bedtime. 90 tablet 3  . Multiple Vitamins-Minerals (CENTRUM ADULTS PO) Take by mouth.    . NONFORMULARY OR COMPOUNDED ITEM B-NOX Pre-workout formula    . Omega-3 Fatty Acids (FISH OIL) 1000 MG CAPS Take by mouth.    . pregabalin (LYRICA) 75 MG capsule Take 1 capsule (75 mg total) by mouth 3 (three) times daily. 270 capsule 1  . SYMBICORT 160-4.5 MCG/ACT inhaler Inhale 2 puffs into the lungs 2 (two) times daily. 10.2 g 11  . Azelastine-Fluticasone 137-50 MCG/ACT SUSP One spray each nostril BID 23 g 11  . cetirizine (ZYRTEC) 10 MG tablet Take 1 tablet (10 mg total) by mouth daily. Can increase to twice daily during allergy season 180 tablet 2  . topiramate (TOPAMAX) 25 MG tablet Take 1-2 tablets (25-50 mg total) by mouth 2 (two) times daily. 180 tablet 1   No current facility-administered medications for this visit.    Allergies  Allergen Reactions  . Robitussin 12 Hour Cough [Dextromethorphan Polistirex Er]   . Phentermine Other (See Comments)    Irritability, increased heart rate    PDMP not reviewed this encounter. Orders Placed This Encounter  Procedures  . CBC  . COMPLETE METABOLIC PANEL WITH GFR  . LIPID SCREENING  . TSH   Meds ordered this encounter  Medications  . Azelastine-Fluticasone 137-50 MCG/ACT SUSP    Sig: One spray each nostril BID    Dispense:  23 g    Refill:  11  . topiramate (TOPAMAX) 25 MG tablet    Sig: Take 1-2 tablets (25-50 mg total) by mouth 2 (two) times daily.    Dispense:  180 tablet    Refill:  1  . cetirizine (ZYRTEC) 10 MG tablet    Sig: Take 1 tablet (10 mg total) by mouth daily. Can increase to twice daily during allergy season    Dispense:  180 tablet    Refill:  2  . fluticasone (FLONASE) 50 MCG/ACT nasal spray    Sig: Place 1 spray into both nostrils daily.    Dispense:  48 g    Refill:  3  . levothyroxine  (SYNTHROID) 25 MCG tablet    Sig: Take 1 tablet (25 mcg total) by mouth daily before breakfast.    Dispense:  90 tablet    Refill:  1  . montelukast (SINGULAIR) 10 MG tablet    Sig: Take 1 tablet (10 mg total) by mouth at bedtime.    Dispense:  90 tablet    Refill:  3  . pregabalin (LYRICA) 75 MG capsule    Sig: Take 1 capsule (75 mg total) by mouth 3 (three) times daily.    Dispense:  270 capsule    Refill:  1    Not to exceed 5 additional fills before 09/06/2019  . SYMBICORT 160-4.5 MCG/ACT inhaler    Sig: Inhale 2 puffs into the lungs 2 (two) times daily.    Dispense:  10.2 g    Refill:  11

## 2019-06-20 ENCOUNTER — Encounter: Payer: Self-pay | Admitting: Osteopathic Medicine

## 2019-06-23 DIAGNOSIS — M4802 Spinal stenosis, cervical region: Secondary | ICD-10-CM

## 2019-06-23 HISTORY — DX: Spinal stenosis, cervical region: M48.02

## 2019-07-06 ENCOUNTER — Encounter: Payer: Self-pay | Admitting: Osteopathic Medicine

## 2019-07-06 MED ORDER — ALBUTEROL SULFATE HFA 108 (90 BASE) MCG/ACT IN AERS: 2.0000 | INHALATION_SPRAY | Freq: Four times a day (QID) | RESPIRATORY_TRACT | 1 refills | Status: DC | PRN

## 2019-07-06 NOTE — Telephone Encounter (Signed)
Patient sent picture on Ventolin. This is not on current med list. Please advise

## 2019-09-29 ENCOUNTER — Other Ambulatory Visit: Payer: Self-pay | Admitting: Osteopathic Medicine

## 2019-11-06 ENCOUNTER — Other Ambulatory Visit: Payer: Self-pay

## 2019-11-06 DIAGNOSIS — R058 Other specified cough: Secondary | ICD-10-CM

## 2019-11-06 NOTE — Progress Notes (Signed)
Opened in error

## 2019-11-13 ENCOUNTER — Telehealth: Payer: Self-pay

## 2019-11-13 DIAGNOSIS — R058 Other specified cough: Secondary | ICD-10-CM

## 2019-11-13 MED ORDER — SYMBICORT 160-4.5 MCG/ACT IN AERO: 2.0000 | INHALATION_SPRAY | Freq: Two times a day (BID) | RESPIRATORY_TRACT | 3 refills | Status: DC

## 2019-11-13 NOTE — Telephone Encounter (Signed)
sent 

## 2019-11-13 NOTE — Telephone Encounter (Signed)
Garrett Watson states his insurance will only cover 90 prescription for Symbicort.

## 2019-11-29 ENCOUNTER — Other Ambulatory Visit: Payer: Self-pay | Admitting: Osteopathic Medicine

## 2019-12-01 ENCOUNTER — Other Ambulatory Visit: Payer: Self-pay | Admitting: Osteopathic Medicine

## 2019-12-01 DIAGNOSIS — G609 Hereditary and idiopathic neuropathy, unspecified: Secondary | ICD-10-CM

## 2019-12-07 ENCOUNTER — Ambulatory Visit (INDEPENDENT_AMBULATORY_CARE_PROVIDER_SITE_OTHER): Payer: BLUE CROSS/BLUE SHIELD | Admitting: Osteopathic Medicine

## 2019-12-07 ENCOUNTER — Encounter: Payer: Self-pay | Admitting: Osteopathic Medicine

## 2019-12-07 ENCOUNTER — Other Ambulatory Visit: Payer: Self-pay

## 2019-12-07 VITALS — BP 129/85 | HR 85 | Temp 98.2°F | Wt 335.1 lb

## 2019-12-07 DIAGNOSIS — F320 Major depressive disorder, single episode, mild: Secondary | ICD-10-CM

## 2019-12-07 DIAGNOSIS — G609 Hereditary and idiopathic neuropathy, unspecified: Secondary | ICD-10-CM

## 2019-12-07 DIAGNOSIS — Z131 Encounter for screening for diabetes mellitus: Secondary | ICD-10-CM

## 2019-12-07 DIAGNOSIS — Z13220 Encounter for screening for lipoid disorders: Secondary | ICD-10-CM

## 2019-12-07 DIAGNOSIS — E039 Hypothyroidism, unspecified: Secondary | ICD-10-CM

## 2019-12-07 DIAGNOSIS — J4520 Mild intermittent asthma, uncomplicated: Secondary | ICD-10-CM

## 2019-12-07 DIAGNOSIS — Z0000 Encounter for general adult medical examination without abnormal findings: Secondary | ICD-10-CM

## 2019-12-07 DIAGNOSIS — K580 Irritable bowel syndrome with diarrhea: Secondary | ICD-10-CM | POA: Insufficient documentation

## 2019-12-07 MED ORDER — PREGABALIN 75 MG PO CAPS: 75.0000 mg | ORAL_CAPSULE | Freq: Three times a day (TID) | ORAL | 3 refills | Status: DC

## 2019-12-07 MED ORDER — DICYCLOMINE HCL 20 MG PO TABS: 20.0000 mg | ORAL_TABLET | Freq: Three times a day (TID) | ORAL | 0 refills | Status: DC

## 2019-12-07 MED ORDER — BUPROPION HCL ER (XL) 150 MG PO TB24
150.0000 mg | ORAL_TABLET | ORAL | 0 refills | Status: DC
Start: 2019-12-07 — End: 2020-02-19

## 2019-12-07 NOTE — Patient Instructions (Addendum)
Mental health Starting Wellbutrin which should help depression and also help with weight loss   Weight Medications approved for long-term use for obesity  Qsymia (Phentermine and Topiramate)  Saxenda (Liraglutide)  Contrave (Bupropion and Naltrexone)  Orlistat (Xenical, Alli)  Bupropion (Wellbutrin) I recommend that you research the above medications and see which one(s) your insurance may or may not cover: If you call your insurance, ask them specifically what medications are on their formulary that are approved for obesity treatment. They should be able to send you a list or tell you over the phone. Remember, medications aren't magic! You MUST be diligent about lifestyle changes as well!     IBS  List to avoid below Dicyclomine +/- Ondansetron as needed for loose stool - try taking before meals   2017 UpToDate Characteristics and sources of common FODMAPs  Word that corresponds to letter in acronym Compounds in this category Foods that contain these compounds  F Fermentable  O Oligosaccharides Fructans, galacto-oligosaccharides Wheat, barley, rye, onion, leek, white part of spring onion, garlic, shallots, artichokes, beetroot, fennel, peas, chicory, pistachio, cashews, legumes, lentils, and chickpeas   D Disaccharides Lactose Milk, custard, ice cream, and yogurt   M Monosaccharides "Free fructose" (fructose in excess of glucose) Apples, pears, mangoes, cherries, watermelon, asparagus, sugar snap peas, honey, high-fructose corn syrup   A And  P Polyols Sorbitol, mannitol, maltitol, and xylitol Apples, pears, apricots, cherries, nectarines, peaches, plums, watermelon, mushrooms, cauliflower, artificially sweetened chewing gum and confectionery   FODMAPs: fermentable oligosaccharides, disaccharides, monosaccharides, and polyols. Adapted by permission from [employer]: [employer]. Agustin Cree, [employer], [place]. Short-chain carbohydrates  and functional gastrointestinal disorders. Am J Gastroenterol 2013; 108:707. Copyright  2013. www.nature.com/ajg. Graphic [id number] Version 2.0     General Preventive Care  Most recent routine screening labs: ordered today.   Blood pressure goal 130/80 or less.   Tobacco: don't!   Alcohol: responsible moderation is ok for most adults - if you have concerns about your alcohol intake, please talk to me!   Exercise: as tolerated to reduce risk of cardiovascular disease and diabetes. Strength training will also prevent osteoporosis.   Mental health: if need for mental health care (medicines, counseling, other), or concerns about moods, please let me know!   Sexual / Reproductive health: if need for STD testing, or if concerns with libido/pain problems, please let me know!  Advanced Directive: Living Will and/or Healthcare Power of Attorney recommended for all adults, regardless of age or health.  Vaccines  Flu vaccine: for almost everyone, every fall.   Shingles vaccine: after age 75.   Pneumonia vaccines: after age 31  Tetanus booster: every 10 years -booster due 2027  COVID vaccine: THANKS for getting your vaccine! :)  Cancer screenings   Colon cancer screening: for everyone age 68-75. Colonoscopy available for all, many people also qualify for the Cologuard stool test   Prostate cancer screening: PSA blood test age 22-71   Lung cancer screening: Not needed unless you take up smoking again Infection screenings  . HIV: recommended screening at least once age 71-65, more often as needed. . Gonorrhea/Chlamydia: screening as needed . Hepatitis C: recommended once for everyone age 85-75 . TB: certain at-risk populations, or depending on work requirements and/or travel history Other . Bone Density Test: recommended for men at age 30 . Abdominal Aortic Aneurysm: screening with ultrasound recommended once for men age 51-75 who have ever smoked

## 2019-12-07 NOTE — Progress Notes (Signed)
Garrett Watson is a 44 y.o. male who presents to  [place] at [facility]  today, 12/07/19, seeking care for the following: . Annual physical  . Concern for intermittent loose stool . Weight  . Mild depression - would like to try Rx, previously on Prozac and not a good fit      ASSESSMENT & PLAN with other pertinent history/findings:  The primary encounter diagnosis was Annual physical exam. Diagnoses of Hypothyroidism, unspecified type, Mild intermittent asthma without complication, Lipid screening, Screening for diabetes mellitus, Idiopathic peripheral neuropathy, Depression, major, single episode, mild ([place]), and Irritable bowel syndrome with diarrhea were also pertinent to this visit.   BP Readings from Last 3 Encounters:  12/07/19 129/85  08/31/18 114/74  07/05/18 123/82   Wt Readings from Last 3 Encounters:  12/07/19 (!) 335 lb 1.3 oz (152 kg)  04/27/19 (!) 316 lb (143.3 kg)  08/31/18 (!) 322 lb (146.1 kg)   Patient Instructions   Mental health Starting Wellbutrin which should help depression and also help with weight loss   Weight Medications approved for long-term use for obesity  Qsymia (Phentermine and Topiramate)  Saxenda (Liraglutide)  Contrave (Bupropion and Naltrexone)  Orlistat (Xenical, Alli)  Bupropion (Wellbutrin) I recommend that you research the above medications and see which one(s) your insurance may or may not cover: If you call your insurance, ask them specifically what medications are on their formulary that are approved for obesity treatment. They should be able to send you a list or tell you over the phone. Remember, medications aren't magic! You MUST be diligent about lifestyle changes as well!     IBS  List to avoid below Dicyclomine +/- Ondansetron as needed for loose stool - try taking before meals   2017 UpToDate Characteristics and sources of common FODMAPs  Word that  corresponds to letter in acronym Compounds in this category Foods that contain these compounds  F Fermentable  O Oligosaccharides Fructans, galacto-oligosaccharides Wheat, barley, rye, onion, leek, white part of spring onion, garlic, shallots, artichokes, beetroot, fennel, peas, chicory, pistachio, cashews, legumes, lentils, and chickpeas   D Disaccharides Lactose Milk, custard, ice cream, and yogurt   M Monosaccharides "Free fructose" (fructose in excess of glucose) Apples, pears, mangoes, cherries, watermelon, asparagus, sugar snap peas, honey, high-fructose corn syrup   A And  P Polyols Sorbitol, mannitol, maltitol, and xylitol Apples, pears, apricots, cherries, nectarines, peaches, plums, watermelon, mushrooms, cauliflower, artificially sweetened chewing gum and confectionery   FODMAPs: fermentable oligosaccharides, disaccharides, monosaccharides, and polyols. Adapted by permission from [employer]: [employer]. Agustin Cree, [employer], [place]. Short-chain carbohydrates and functional gastrointestinal disorders. Am J Gastroenterol 2013; 108:707. Copyright  2013. www.nature.com/ajg. Graphic [phone] Version 2.0     General Preventive Care  Most recent routine screening labs: ordered today.   Blood pressure goal 130/80 or less.   Tobacco: don't!   Alcohol: responsible moderation is ok for most adults - if you have concerns about your alcohol intake, please talk to me!   Exercise: as tolerated to reduce risk of cardiovascular disease and diabetes. Strength training will also prevent osteoporosis.   Mental health: if need for mental health care (medicines, counseling, other), or concerns about moods, please let me know!   Sexual / Reproductive health: if need for STD testing, or if concerns with libido/pain problems, please let me know!  Advanced Directive: Living Will and/or Healthcare Power of Attorney recommended for all adults, regardless of  age or health.  Vaccines  Flu vaccine: for almost everyone, every fall.   Shingles vaccine: after age 42.   Pneumonia vaccines: after age 22  Tetanus booster: every 10 years -booster due 2027  COVID vaccine: THANKS for getting your vaccine! :)  Cancer screenings   Colon cancer screening: for everyone age 2-75. Colonoscopy available for all, many people also qualify for the Cologuard stool test   Prostate cancer screening: PSA blood test age 84-71   Lung cancer screening: Not needed unless you take up smoking again Infection screenings  . HIV: recommended screening at least once age 76-65, more often as needed. . Gonorrhea/Chlamydia: screening as needed . Hepatitis C: recommended once for everyone age 59-75 . TB: certain at-risk populations, or depending on work requirements and/or travel history Other . Bone Density Test: recommended for men at age 53 . Abdominal Aortic Aneurysm: screening with ultrasound recommended once for men age 44-75 who have ever smoked     Orders Placed This Encounter  Procedures  . CBC  . COMPLETE METABOLIC PANEL WITH GFR  . Lipid panel  . TSH  . Hemoglobin A1c    Meds ordered this encounter  Medications  . buPROPion (WELLBUTRIN XL) 150 MG 24 hr tablet    Sig: Take 1 tablet (150 mg total) by mouth every morning.    Dispense:  90 tablet    Refill:  0  . dicyclomine (BENTYL) 20 MG tablet    Sig: Take 1 tablet (20 mg total) by mouth 4 (four) times daily -  before meals and at bedtime. As needed for abdominal cramps /  Loose stool    Dispense:  90 tablet    Refill:  0  . pregabalin (LYRICA) 75 MG capsule    Sig: Take 1 capsule (75 mg total) by mouth 3 (three) times daily.    Dispense:  270 capsule    Refill:  3    Not to exceed 5 additional fills before 09/06/2019       Follow-up instructions: Return in about 1 year (around 12/06/2020) for ANNUAL in 1 year (call week prior to visit for lab orders). 6 weeks office/virtual follow up  mental.                                         BP 129/85 (BP Location: Right Arm, Patient Position: Sitting, Cuff Size: Large)   Pulse 85   Temp 98.2 F (36.8 C) (Oral)   Wt (!) 335 lb 1.3 oz (152 kg)   BMI 48.08 kg/m   Detailed physical exam normal  Current Meds  Medication Sig  . albuterol (VENTOLIN HFA) 108 (90 Base) MCG/ACT inhaler Inhale 2 puffs into the lungs every 6 (six) hours as needed for wheezing.  . Azelastine-Fluticasone 137-50 MCG/ACT SUSP One spray each nostril BID  . B Complex Vitamins (B COMPLEX 100 PO) Take by mouth.  . cetirizine (ZYRTEC) 10 MG tablet Take 1 tablet (10 mg total) by mouth daily. Can increase to twice daily during allergy season  . fluticasone (FLONASE) 50 MCG/ACT nasal spray Place 1 spray into both nostrils daily.  Marland Kitchen levothyroxine (SYNTHROID) 25 MCG tablet Take 1 tablet (25 mcg total) by mouth daily before breakfast.  . montelukast (SINGULAIR) 10 MG tablet Take 1 tablet (10 mg total) by mouth at bedtime.  . Multiple Vitamins-Minerals (CENTRUM ADULTS PO) Take by mouth.  . NONFORMULARY  OR COMPOUNDED ITEM B-NOX Pre-workout formula  . Omega-3 Fatty Acids (FISH OIL) 1000 MG CAPS Take by mouth.  . SYMBICORT 160-4.5 MCG/ACT inhaler Inhale 2 puffs into the lungs 2 (two) times daily.    No results found for this or any previous visit (from the past 72 hour(s)).  No results found.  Depression screen [facility] 2/9 12/07/2019 04/27/2019 07/05/2018  Decreased Interest 2 1 0  Down, Depressed, Hopeless 1 0 0  PHQ - 2 Score 3 1 0  Altered sleeping 3 1 0  Tired, decreased energy 2 0 0  Change in appetite 1 1 0  Feeling bad or failure about yourself  1 0 0  Trouble concentrating 3 0 1  Moving slowly or fidgety/restless 0 1 0  Suicidal thoughts 0 0 0  PHQ-9 Score 13 4 1   Difficult doing work/chores Somewhat difficult Somewhat difficult -    GAD 7 : Generalized Anxiety Score 12/07/2019 04/27/2019 07/05/2018  Nervous, Anxious,  on Edge 1 0 0  Control/stop worrying 1 0 0  Worry too much - different things 1 1 0  Trouble relaxing 3 0 0  Restless 1 1 0  Easily annoyed or irritable 2 1 0  Afraid - awful might happen 0 0 0  Total GAD 7 Score 9 3 0  Anxiety Difficulty Somewhat difficult Somewhat difficult -      All questions at time of visit were answered - patient instructed to contact office with any additional concerns or updates.  ER/RTC precautions were reviewed with the patient.  Please note: voice recognition software was used to produce this document, and typos may escape review. Please contact Dr. Sheppard Coil for any needed clarifications.

## 2019-12-08 LAB — LIPID PANEL
Cholesterol: 189 mg/dL (ref <200)
HDL: 38 mg/dL — ABNORMAL LOW (ref >=40)
LDL Cholesterol (Calc): 122 mg/dL (calc) — ABNORMAL HIGH
Non-HDL Cholesterol (Calc): 151 mg/dL (calc) — ABNORMAL HIGH (ref <130)
Total CHOL/HDL Ratio: 5 (calc) — ABNORMAL HIGH (ref <5.0)
Triglycerides: 169 mg/dL — ABNORMAL HIGH (ref <150)

## 2019-12-08 LAB — COMPLETE METABOLIC PANEL WITH GFR
AG Ratio: 1.7 (calc) (ref 1.0–2.5)
ALT: 23 U/L (ref 9–46)
AST: 21 U/L (ref 10–40)
Albumin: 4.3 g/dL (ref 3.6–5.1)
Alkaline phosphatase (APISO): 35 U/L — ABNORMAL LOW (ref 36–130)
BUN: 24 mg/dL (ref 7–25)
CO2: 28 mmol/L (ref 20–32)
Calcium: 9.7 mg/dL (ref 8.6–10.3)
Chloride: 102 mmol/L (ref 98–110)
Creat: 0.89 mg/dL (ref 0.60–1.35)
GFR, Est African American: 121 mL/min/{1.73_m2} (ref >=60)
GFR, Est Non African American: 105 mL/min/{1.73_m2} (ref >=60)
Globulin: 2.5 g/dL (calc) (ref 1.9–3.7)
Glucose, Bld: 81 mg/dL (ref 65–99)
Potassium: 4.2 mmol/L (ref 3.5–5.3)
Sodium: 138 mmol/L (ref 135–146)
Total Bilirubin: 0.8 mg/dL (ref 0.2–1.2)
Total Protein: 6.8 g/dL (ref 6.1–8.1)

## 2019-12-08 LAB — CBC
HCT: 44.9 % (ref 38.5–50.0)
Hemoglobin: 15.7 g/dL (ref 13.2–17.1)
MCH: 30.7 pg (ref 27.0–33.0)
MCHC: 35 g/dL (ref 32.0–36.0)
MCV: 87.9 fL (ref 80.0–100.0)
MPV: 10.5 fL (ref 7.5–12.5)
Platelets: 180 10*3/uL (ref 140–400)
RBC: 5.11 10*6/uL (ref 4.20–5.80)
RDW: 13 % (ref 11.0–15.0)
WBC: 5 10*3/uL (ref 3.8–10.8)

## 2019-12-08 LAB — TSH: TSH: 2.03 mIU/L (ref 0.40–4.50)

## 2019-12-08 LAB — HEMOGLOBIN A1C
Hgb A1c MFr Bld: 5 % of total Hgb (ref <5.7)
Mean Plasma Glucose: 97 (calc)
eAG (mmol/L): 5.4 (calc)

## 2019-12-14 ENCOUNTER — Other Ambulatory Visit: Payer: Self-pay | Admitting: Osteopathic Medicine

## 2019-12-14 DIAGNOSIS — E038 Other specified hypothyroidism: Secondary | ICD-10-CM

## 2019-12-21 ENCOUNTER — Other Ambulatory Visit: Payer: Self-pay | Admitting: Osteopathic Medicine

## 2019-12-27 ENCOUNTER — Ambulatory Visit: Payer: BLUE CROSS/BLUE SHIELD | Admitting: Sports Medicine

## 2020-01-25 ENCOUNTER — Other Ambulatory Visit: Payer: Self-pay | Admitting: Osteopathic Medicine

## 2020-02-12 ENCOUNTER — Other Ambulatory Visit: Payer: Self-pay | Admitting: Osteopathic Medicine

## 2020-02-19 ENCOUNTER — Other Ambulatory Visit: Payer: Self-pay | Admitting: Osteopathic Medicine

## 2020-02-19 NOTE — Telephone Encounter (Signed)
Last refill 12/07/2019 Last ov 12/07/2019

## 2020-04-24 ENCOUNTER — Encounter: Payer: Self-pay | Admitting: Osteopathic Medicine

## 2020-04-24 ENCOUNTER — Ambulatory Visit (INDEPENDENT_AMBULATORY_CARE_PROVIDER_SITE_OTHER): Payer: BC Managed Care – PPO | Admitting: Osteopathic Medicine

## 2020-04-24 VITALS — BP 128/71 | HR 72 | Temp 98.7°F | Wt 321.1 lb

## 2020-04-24 DIAGNOSIS — R142 Eructation: Secondary | ICD-10-CM

## 2020-04-24 DIAGNOSIS — K760 Fatty (change of) liver, not elsewhere classified: Secondary | ICD-10-CM

## 2020-04-24 DIAGNOSIS — R10821 Right upper quadrant rebound abdominal tenderness: Secondary | ICD-10-CM

## 2020-04-24 DIAGNOSIS — R140 Abdominal distension (gaseous): Secondary | ICD-10-CM | POA: Diagnosis not present

## 2020-04-24 DIAGNOSIS — Z23 Encounter for immunization: Secondary | ICD-10-CM | POA: Diagnosis not present

## 2020-04-24 DIAGNOSIS — K582 Mixed irritable bowel syndrome: Secondary | ICD-10-CM | POA: Diagnosis not present

## 2020-04-24 DIAGNOSIS — R109 Unspecified abdominal pain: Secondary | ICD-10-CM | POA: Diagnosis not present

## 2020-04-24 NOTE — Progress Notes (Signed)
Garrett Watson is a 44 y.o. male who presents to  [facility]  today, 04/24/20, seeking care for the following:  . Persistent GI issues: Abdominal bloating, intermittent abdominal pain, migrating.  Has tried low FODMAP diet, dicyclomine.  On exam, there are normal bowel sounds in all 4 quadrants, some tenderness to palpation in right upper quadrant, left lower quadrant.  Patient denies black/bloody stool, severe nausea/vomiting though occasionally has mild nausea.  No significant GERD symptoms.  Reports frequent belching.     ASSESSMENT & PLAN with other pertinent findings:  The primary encounter diagnosis was Irritable bowel syndrome with both constipation and diarrhea. Diagnoses of Need for influenza vaccination, Abdominal bloating, Abdominal cramps, Belching, Right upper quadrant abdominal tenderness with rebound tenderness, and Hepatic steatosis were also pertinent to this visit.   No results found for this or any previous visit (from the past 24 hour(s)).  Abdominal ultrasound demonstrates pretty significant hepatic steatosis, borderline cirrhosis.  Probable nonalcoholic fatty liver disease, referral placed to GI for this and other issues, may need to consider liver biopsy versus other intervention.  Patient is working on [employer] loss/healthy diet.    There are no Patient Instructions on file for this visit.  Orders Placed This Encounter  Procedures  . US ABDOMEN LIMITED RUQ (LIVER/GB)  . Flu Vaccine QUAD 6+ mos PF IM (Fluarix Quad PF)  . H. pylori breath test  . Ambulatory referral to Gastroenterology    No orders of the defined types were placed in this encounter.      Follow-up instructions: Return if symptoms worsen or fail to improve.                                         BP 128/71 (BP Location: Left Arm, Patient Position: Sitting, Cuff Size: Large)   Pulse 72    Temp 98.7 F (37.1 C) (Oral)   Wt (!) 321 lb 1.9 oz (145.7 kg)   BMI 46.08 kg/m   Current Meds  Medication Sig  . Azelastine-Fluticasone 137-50 MCG/ACT SUSP One spray each nostril BID  . B Complex Vitamins (B COMPLEX 100 PO) Take by mouth.  Marland Kitchen buPROPion (WELLBUTRIN XL) 150 MG 24 hr tablet TAKE 1 TABLET BY MOUTH EVERY DAY IN THE MORNING  . cetirizine (ZYRTEC) 10 MG tablet Take 1 tablet (10 mg total) by mouth daily. Can increase to twice daily during allergy season  . dicyclomine (BENTYL) 20 MG tablet TAKE 1 TABLET (20 MG TOTAL) BY MOUTH 4 (FOUR) TIMES DAILY - BEFORE MEALS AND AT BEDTIME. AS NEEDED FOR ABDOMINAL CRAMPS / LOOSE STOOL  . fluticasone (FLONASE) 50 MCG/ACT nasal spray Place 1 spray into both nostrils daily.  Marland Kitchen levothyroxine (SYNTHROID) 25 MCG tablet TAKE 1 TABLET BY MOUTH DAILY BEFORE BREAKFAST.  . montelukast (SINGULAIR) 10 MG tablet Take 1 tablet (10 mg total) by mouth at bedtime.  . Multiple Vitamins-Minerals (CENTRUM ADULTS PO) Take by mouth.  . NONFORMULARY OR COMPOUNDED ITEM B-NOX Pre-workout formula  . SYMBICORT 160-4.5 MCG/ACT inhaler Inhale 2 puffs into the lungs 2 (two) times daily.    No results found for this or any previous visit (from the past 72 hour(s)).  US ABDOMEN LIMITED RUQ (LIVER/GB)  Result Date: 04/26/2020 CLINICAL DATA:  IBS, bloating, cramping and belching with 2 years of intermittent right upper quadrant pain EXAM: ULTRASOUND ABDOMEN LIMITED  RIGHT UPPER QUADRANT COMPARISON:  None. FINDINGS: Gallbladder: No gallstones or wall thickening visualized. No sonographic Murphy sign noted by sonographer. Common bile duct: Not well visualized. Liver: Diffusely increased hepatic echogenicity with markedly diminished through transmission. Some slight liver surface nodularity is noted as well. No focal liver lesion is visible though limited by the diminished sonographic penetration. No visible intrahepatic biliary dilatation. Portal vein is patent on color Doppler  imaging with normal direction of blood flow towards the liver. Other: None. IMPRESSION: 1. Diffusely increased hepatic echogenicity with diminished through transmission and liver surface nodularity. Findings are nonspecific but can be seen in the setting of moderate to severe hepatic steatosis and/or cirrhosis. 2. Nonvisualization of the common bile duct due to diminished through transmission. No visible intrahepatic biliary dilatation. Electronically Signed   By: Lovena Le M.D.   On: 04/26/2020 04:13       All questions at time of visit were answered - patient instructed to contact office with any additional concerns or updates.  ER/RTC precautions were reviewed with the patient as applicable.   Please note: voice recognition software was used to produce this document, and typos may escape review. Please contact Dr. Sheppard Coil for any needed clarifications.

## 2020-04-25 ENCOUNTER — Ambulatory Visit (INDEPENDENT_AMBULATORY_CARE_PROVIDER_SITE_OTHER): Payer: BC Managed Care – PPO

## 2020-04-25 ENCOUNTER — Other Ambulatory Visit: Payer: Self-pay

## 2020-04-25 DIAGNOSIS — R140 Abdominal distension (gaseous): Secondary | ICD-10-CM

## 2020-04-25 DIAGNOSIS — R10821 Right upper quadrant rebound abdominal tenderness: Secondary | ICD-10-CM

## 2020-04-25 DIAGNOSIS — R142 Eructation: Secondary | ICD-10-CM | POA: Diagnosis not present

## 2020-04-25 DIAGNOSIS — K582 Mixed irritable bowel syndrome: Secondary | ICD-10-CM

## 2020-04-29 ENCOUNTER — Encounter: Payer: Self-pay | Admitting: Osteopathic Medicine

## 2020-04-30 ENCOUNTER — Ambulatory Visit (INDEPENDENT_AMBULATORY_CARE_PROVIDER_SITE_OTHER): Payer: BC Managed Care – PPO | Admitting: Sports Medicine

## 2020-04-30 ENCOUNTER — Other Ambulatory Visit: Payer: Self-pay | Admitting: Osteopathic Medicine

## 2020-04-30 DIAGNOSIS — M67922 Unspecified disorder of synovium and tendon, left upper arm: Secondary | ICD-10-CM | POA: Diagnosis not present

## 2020-04-30 DIAGNOSIS — M48061 Spinal stenosis, lumbar region without neurogenic claudication: Secondary | ICD-10-CM | POA: Diagnosis not present

## 2020-04-30 DIAGNOSIS — M79641 Pain in right hand: Secondary | ICD-10-CM | POA: Insufficient documentation

## 2020-04-30 DIAGNOSIS — M7661 Achilles tendinitis, right leg: Secondary | ICD-10-CM | POA: Insufficient documentation

## 2020-04-30 MED ORDER — NITROGLYCERIN 0.2 MG/HR TD PT24: MEDICATED_PATCH | TRANSDERMAL | 11 refills | Status: AC

## 2020-04-30 MED ORDER — DEXAMETHASONE 4 MG PO TABS: 4.0000 mg | ORAL_TABLET | Freq: Three times a day (TID) | ORAL | 0 refills | Status: DC

## 2020-04-30 NOTE — Assessment & Plan Note (Signed)
Good improvement with the distal biceps injection back in 2019, increasing in pain recently, the treatment as above will probably help this for now.

## 2020-04-30 NOTE — Assessment & Plan Note (Signed)
Pain at the right Achilles insertion, no trauma, negative Thompson's test. Adding topical nitroglycerin, heel lifts, formal physical therapy with eccentric rehabilitation. Return to see me in 6 to 8 weeks for this.

## 2020-04-30 NOTE — Progress Notes (Signed)
    Procedures performed today:    None.  Independent interpretation of notes and tests performed by another provider:   MRI from 2019 shows multifactorial central canal stenosis at L4-L5 with mild bilateral foraminal stenosis at this level.  Brief History, Exam, Impression, and Recommendations:    Left distal biceps tendinopathy Good improvement with the distal biceps injection back in 2019, increasing in pain recently, the treatment as above will probably help this for now.  Right insertional Achilles tendinitis Pain at the right Achilles insertion, no trauma, negative Thompson's test. Adding topical nitroglycerin, heel lifts, formal physical therapy with eccentric rehabilitation. Return to see me in 6 to 8 weeks for this.  Right hand pain Unclear etiology pain at the foot first dorsal interosseous. Exam is benign, negative Tinel's and Phalen signs, no pain at the first [facility]. Adding x-rays, 5 days of Decadron, we will watch this for now.  Lumbar spinal stenosis Multifactorial central canal stenosis worst at L4-L5, he has had a couple of epidurals back in 2019 that were not effective, I would like a second opinion from Dr. Lynann Bologna, I do suspect he will be needing updated lumbar spine MRI. Continue Lyrica for now, we are limited on NSAIDs and acetaminophen due to current Nash syndrome and esophageal varices.    ___________________________________________ Gwen Her. Dianah Field, M.D., ABFM., CAQSM. Primary Care and [place] Instructor of [place] of [facility]

## 2020-04-30 NOTE — Assessment & Plan Note (Signed)
Unclear etiology pain at the foot first dorsal interosseous. Exam is benign, negative Tinel's and Phalen signs, no pain at the first [facility] [place]. Adding x-rays, 5 days of Decadron, we will watch this for now.

## 2020-04-30 NOTE — Assessment & Plan Note (Signed)
Multifactorial central canal stenosis worst at L4-L5, he has had a couple of epidurals back in 2019 that were not effective, I would like a second opinion from Dr. Lynann Bologna, I do suspect he will be needing updated lumbar spine MRI. Continue Lyrica for now, we are limited on NSAIDs and acetaminophen due to current Nash syndrome and esophageal varices.

## 2020-05-10 ENCOUNTER — Ambulatory Visit (INDEPENDENT_AMBULATORY_CARE_PROVIDER_SITE_OTHER): Payer: BC Managed Care – PPO

## 2020-05-10 ENCOUNTER — Other Ambulatory Visit: Payer: Self-pay

## 2020-05-10 ENCOUNTER — Ambulatory Visit (INDEPENDENT_AMBULATORY_CARE_PROVIDER_SITE_OTHER): Payer: BC Managed Care – PPO | Admitting: Rehabilitative and Restorative Service Providers"

## 2020-05-10 DIAGNOSIS — R29898 Other symptoms and signs involving the musculoskeletal system: Secondary | ICD-10-CM | POA: Diagnosis not present

## 2020-05-10 DIAGNOSIS — M79641 Pain in right hand: Secondary | ICD-10-CM

## 2020-05-10 DIAGNOSIS — M6281 Muscle weakness (generalized): Secondary | ICD-10-CM

## 2020-05-10 DIAGNOSIS — M7661 Achilles tendinitis, right leg: Secondary | ICD-10-CM

## 2020-05-10 DIAGNOSIS — M25571 Pain in right ankle and joints of right foot: Secondary | ICD-10-CM | POA: Diagnosis not present

## 2020-05-10 DIAGNOSIS — M79671 Pain in right foot: Secondary | ICD-10-CM

## 2020-05-10 NOTE — Therapy (Signed)
[place], [place], [address] Phone: [phone]   Fax:  [phone]  Physical Therapy Evaluation  Patient Details  Name: Garrett Watson MRN: [id number] Date of Birth: [birth year] Referring Provider (PT): Aundria Mems, MD   Encounter Date: 05/10/2020   PT End of Session - 05/10/20 1448    Visit Number 1    Number of Visits 12    Date for PT Re-Evaluation 06/24/20    Authorization Type BCBS 23 visit/year    Authorization - Number of Visits 30    PT Start Time 1410    PT Stop Time 1450    PT Time Calculation (min) 40 min    Activity Tolerance Patient tolerated treatment well    Behavior During Therapy [facility] for tasks assessed/performed           Past Medical History:  Diagnosis Date  . Allergy   . Erectile dysfunction   . Former cigarette smoker   . Hypogonadism male   . Hypokalemia   . Obesity   . Thyroid disease     Past Surgical History:  Procedure Laterality Date  . KNEE ARTHROSCOPY Right    2002, 2018  . OSTEOTOMY MAXILLARY      There were no vitals filed for this visit.    Subjective Assessment - 05/10/20 1418    Subjective The patient has h/o multiple orthopedic issues.  He has h/o L4-L5 canal stenosis with epidurals (no relief). He is being evaluated by a surgeon next week and then a GI doctor on 05/23/20.  For his R achilles pain, he is using heel lifts intermittently, has had steroids and nitroglycerin patches.  He is noticing a huge difference in pain since those 3 interventions.    Pertinent History hypothyroidism, obesity, lumbar stenosis, peripheral neuropathy.    Patient Stated Goals pain free if able, improve stability/mobility    Currently in Pain? No/denies   none currently; does get intermittent pain   Pain Location Ankle    Pain Orientation Right    Pain Descriptors / Indicators Aching;Sore    Pain Onset More than a month ago    Pain Frequency  Intermittent    Aggravating Factors  walking    Pain Relieving Factors using nitropatches, recent steroids              [facility] PT Assessment - 05/10/20 1421      Assessment   Medical Diagnosis R achilles tendonitis    Referring Provider (PT) Aundria Mems, MD    Onset Date/Surgical Date 04/30/20    Hand Dominance Right    Prior Therapy for h/o knee scopes      Precautions   Precautions None      Restrictions   Weight Bearing Restrictions No      Balance Screen   Has the patient fallen in the past 6 months No    Has the patient had a decrease in activity level because of a fear of falling?  No    Is the patient reluctant to leave their home because of a fear of falling?  No      Home Environment   Living Environment Private residence    Living Arrangements Other relatives   siblings and mom   Type of [place] Access Level entry      Prior Function   Level of Independence Independent    Vocation Full time employment    Vocation Requirements up  and down throughout the day    Leisure also helps a friend out at [employer]      Observation/Other Assessments   Focus on Therapeutic Outcomes (FOTO)  35% limitation      ROM / Strength   AROM / PROM / Strength AROM;Strength      AROM   Overall AROM  Within functional limits for tasks performed    AROM Assessment Site Ankle    Right/Left Ankle Right    Right Ankle Dorsiflexion 14    Right Ankle Plantar Flexion 55      Strength   Overall Strength Deficits   gets pain with bilat heel raises in standing   Overall Strength Comments *upper legs feel tingly after doing MMT.      Strength Assessment Site Hip;Knee;Ankle    Right/Left Hip Right;Left    Right Hip Flexion 5/5    Left Hip Flexion 5/5    Right/Left Knee Right;Left    Right Knee Flexion 5/5    Right Knee Extension 5/5    Left Knee Flexion 5/5    Left Knee Extension 5/5    Right/Left Ankle Right;Left    Right Ankle Dorsiflexion 5/5    Left  Ankle Dorsiflexion 5/5      Flexibility   Soft Tissue Assessment /Muscle Length yes   R gastrocnemius tightness     Palpation   Palpation comment tenderness to palpation in R gastroc and soleous; multiple trigger points in R lower leg, tenderness to palpation insertion of achilles (he notes much improvement in pain since seeing Dr. Darene Lamer)      Ambulation/Gait   Ambulation/Gait Yes    Gait Comments no evidence of antalgic pattern; further gait assessment to follow      High Level Balance   High Level Balance Comments L SLS to 10 seconds, R SLS to 3 seconds                      Objective measurements completed on examination: See above findings.       [place] Adult PT Treatment/Exercise - 05/10/20 1421      Exercises   Exercises Ankle      Ankle Exercises: Stretches   Gastroc Stretch 1 rep;30 seconds      Ankle Exercises: Standing   SLS single limb stance      Ankle Exercises: Seated   Other Seated Ankle Exercises eccentric PF with green band                  PT Education - 05/10/20 1448    Education Details HEP    Person(s) Educated Patient    Methods Explanation;Demonstration;Handout    Comprehension Returned demonstration;Verbalized understanding               PT Long Term Goals - 05/10/20 1705      PT LONG TERM GOAL #1   Title The patient will be indep with HEP for LE strength and flexibility.    Time 6    Period Weeks    Target Date 06/21/20      PT LONG TERM GOAL #2   Title The patient will reduce functional limitation per FOTO from 35% to < or equal to 25%.    Time 6    Period Weeks    Target Date 06/21/20      PT LONG TERM GOAL #3   Title The patient will report return to walking > 1 mile without R heel pain.  Time 6    Period Weeks    Target Date 06/21/20      PT LONG TERM GOAL #4   Title The patient will maintain R SLS x 10 seconds.    Time 6    Period Weeks    Target Date 06/21/20      PT LONG TERM GOAL #5   Title  The patient will return to gym routine (as able due to other medical issues).    Time 6    Period Weeks    Target Date 06/21/20                  Plan - 05/10/20 1706    Clinical Impression Statement The patient is a 44 yo male presenting to OP physical therapy with h/o R achilles tendonitis that has improved with recent medical interventions.  He presents today with myofascial tightness R gastrocnemius, insertinal heel pain, weakness R ankle for heel raise with pain, dec'd stability for balance.  PT to address deficits working towards return to exercise routine.    Personal Factors and Comorbidities Comorbidity 3+    Comorbidities obesity, lumbar stenosis, h/o R knee pain    Examination-Activity Limitations Lift;Locomotion Level;Squat;Stairs    Examination-Participation Restrictions Community Activity    Stability/Clinical Decision Making Stable/Uncomplicated    Clinical Decision Making Low    Rehab Potential Good    PT Frequency 2x / week    PT Duration 6 weeks    PT Treatment/Interventions ADLs/Self Care Home Management;Iontophoresis 4mg /ml Dexamethasone;Moist Heat;Electrical Stimulation;Cryotherapy;Gait training;Stair training;Functional mobility training;Therapeutic activities;Therapeutic exercise;Balance training;Taping;Patient/family education;Manual techniques;Dry needling    PT Next Visit Plan progress loading to tolerance, DN for gastroc trigger points if needed or STM, slow progression for walking    PT Home Exercise Plan Access Code: [email or web address]    Consulted and Agree with Plan of Care Patient           Patient will benefit from skilled therapeutic intervention in order to improve the following deficits and impairments:  Pain, Hypomobility, Impaired flexibility, Decreased strength, Increased fascial restricitons  Visit Diagnosis: Pain in right ankle and joints of right foot  Muscle weakness (generalized)  Other symptoms and signs involving the musculoskeletal  system     Problem List Patient Active Problem List   Diagnosis Date Noted  . Right insertional Achilles tendinitis 04/30/2020  . Right hand pain 04/30/2020  . Lumbar spinal stenosis 04/30/2020  . Irritable bowel syndrome with diarrhea 12/07/2019  . Depression, major, single episode, mild ([place]) 12/07/2019  . Mild intermittent asthma without complication 58/85/0277  . Recurrent productive cough 08/31/2018  . Abnormal weight gain 07/05/2018  . Non-seasonal allergic rhinitis 07/05/2018  . Class 3 severe obesity due to excess calories with serious comorbidity and body mass index (BMI) of 45.0 to 49.9 in adult ([place]) 07/05/2018  . Hypercalcemia 10/03/2017  . Forearm tendonitis 10/03/2017  . Secondary male hypogonadism 10/03/2017  . Hypothyroidism 10/01/2017  . Idiopathic peripheral neuropathy 10/01/2017  . Left distal biceps tendinopathy 10/01/2017    [place] , PT 05/10/2020, 5:10 PM  [facility] [place], [place], [address] Phone: [phone]   Fax:  [phone]  Name: DURANT SCIBILIA MRN: [id number] Date of Birth: Nov 09, 1975

## 2020-05-10 NOTE — Patient Instructions (Signed)
Access Code: [email or web address] URL: [email or web address] Date: 05/10/2020 Prepared by: Rudell Cobb  Exercises Gastroc Stretch on Wall - 2 x daily - 7 x weekly - 1 sets - 2-3 reps - 30 seconds hold Single Leg Stance - 2 x daily - 7 x weekly - 1 sets - 3 reps - 10-15 seconds hold Seated Eccentric Ankle Plantar Flexion with Resistance - 2 x daily - 7 x weekly - 1 sets - 10 reps

## 2020-05-15 ENCOUNTER — Ambulatory Visit (INDEPENDENT_AMBULATORY_CARE_PROVIDER_SITE_OTHER): Payer: BC Managed Care – PPO | Admitting: Rehabilitative and Restorative Service Providers"

## 2020-05-15 ENCOUNTER — Other Ambulatory Visit: Payer: Self-pay

## 2020-05-15 DIAGNOSIS — R29898 Other symptoms and signs involving the musculoskeletal system: Secondary | ICD-10-CM | POA: Diagnosis not present

## 2020-05-15 DIAGNOSIS — M6281 Muscle weakness (generalized): Secondary | ICD-10-CM | POA: Diagnosis not present

## 2020-05-15 DIAGNOSIS — M25571 Pain in right ankle and joints of right foot: Secondary | ICD-10-CM | POA: Diagnosis not present

## 2020-05-15 NOTE — Therapy (Signed)
[place], [place], [address] Phone: [phone]   Fax:  [phone]  Physical Therapy Treatment  Patient Details  Name: Garrett Watson MRN: [id number] Date of Birth: 01-Jun-1976 Referring Provider (PT): Aundria Mems, MD   Encounter Date: 05/15/2020   PT End of Session - 05/15/20 0725    Visit Number 2    Number of Visits 12    Date for PT Re-Evaluation 06/24/20    Authorization Type BCBS 36 visit/year    Authorization - Number of Visits 30    PT Start Time 0717    PT Stop Time 0800    PT Time Calculation (min) 43 min    Activity Tolerance Patient tolerated treatment well    Behavior During Therapy [facility] for tasks assessed/performed           Past Medical History:  Diagnosis Date  . Allergy   . Erectile dysfunction   . Former cigarette smoker   . Hypogonadism male   . Hypokalemia   . Obesity   . Thyroid disease     Past Surgical History:  Procedure Laterality Date  . KNEE ARTHROSCOPY Right    2002, 2018  . OSTEOTOMY MAXILLARY      There were no vitals filed for this visit.   Subjective Assessment - 05/15/20 0722    Subjective The patient reports he was found to have cervical myelopathy and is getting an MRI scheduled to determine surgical course.    Pertinent History hypothyroidism, obesity, lumbar stenosis, peripheral neuropathy.    Patient Stated Goals pain free if able, improve stability/mobility    Currently in Pain? No/denies              [facility] PT Assessment - 05/15/20 0725      Assessment   Medical Diagnosis R achilles tendonitis    Referring Provider (PT) Aundria Mems, MD    Onset Date/Surgical Date 04/30/20    Hand Dominance Right      Ambulation/Gait   Ambulation/Gait Yes    Gait Comments Patient has ankle inversion during gait.  Posture in standing is not in ankle inversion.  *Patient wears boots to help support his ankles.                            [place] Adult PT Treatment/Exercise - 05/15/20 0725      Exercises   Exercises Ankle      Manual Therapy   Manual Therapy Soft tissue mobilization    Manual therapy comments to reduce muscle tightness; skilled palpation to assess tissue response to dry needling.    Soft tissue mobilization R gastroc and soleous musculature      Ankle Exercises: Standing   Vector Stance Right;5 reps    Vector Stance Limitations lateral reaching    SLS SLS on R leg     Heel Raises Both;10 reps    Toe Raise 10 reps    Other Standing Ankle Exercises Attempted mini split lunge squat-- with pain in knees, also notes pain in knees with lateral reaching in R LE stance.              Trigger Point Dry Needling - 05/15/20 0744    Consent Given? Yes    Education Handout Provided Yes    Muscles Treated Lower Quadrant Gastrocnemius;Soleus    Dry Needling Comments right LE    Gastrocnemius Response Twitch response elicited;Palpable increased muscle length  Soleus Response Twitch response elicited;Palpable increased muscle length                PT Education - 05/15/20 0757    Education Details HEP    Person(s) Educated Patient    Methods Explanation;Demonstration;Handout    Comprehension Verbalized understanding;Returned demonstration               PT Long Term Goals - 05/10/20 1705      PT LONG TERM GOAL #1   Title The patient will be indep with HEP for LE strength and flexibility.    Time 6    Period Weeks    Target Date 06/21/20      PT LONG TERM GOAL #2   Title The patient will reduce functional limitation per FOTO from 35% to < or equal to 25%.    Time 6    Period Weeks    Target Date 06/21/20      PT LONG TERM GOAL #3   Title The patient will report return to walking > 1 mile without R heel pain.    Time 6    Period Weeks    Target Date 06/21/20      PT LONG TERM GOAL #4   Title The patient will maintain R SLS x 10 seconds.    Time 6     Period Weeks    Target Date 06/21/20      PT LONG TERM GOAL #5   Title The patient will return to gym routine (as able due to other medical issues).    Time 6    Period Weeks    Target Date 06/21/20                 Plan - 05/15/20 0726    Clinical Impression Statement The patient reports today that he has MRI scheduled due to evidence of cervical myelopathy on x-ray.  PT progressed ther ex to tolerance today and also did DN for myofascial release.  PT to continue working to [place] and will modify plan based on other acute needs (neck).    Stability/Clinical Decision Making Stable/Uncomplicated    Rehab Potential Good    PT Frequency 2x / week    PT Duration 6 weeks    PT Treatment/Interventions ADLs/Self Care Home Management;Iontophoresis 4mg /ml Dexamethasone;Moist Heat;Electrical Stimulation;Cryotherapy;Gait training;Stair training;Functional mobility training;Therapeutic activities;Therapeutic exercise;Balance training;Taping;Patient/family education;Manual techniques;Dry needling    PT Next Visit Plan progress loading to tolerance, DN for gastroc trigger points if needed or STM, slow progression for walking    PT Home Exercise Plan Access Code: [email or web address]    Consulted and Agree with Plan of Care Patient           Patient will benefit from skilled therapeutic intervention in order to improve the following deficits and impairments:  Pain, Hypomobility, Impaired flexibility, Decreased strength, Increased fascial restricitons  Visit Diagnosis: Pain in right ankle and joints of right foot  Muscle weakness (generalized)  Other symptoms and signs involving the musculoskeletal system     Problem List Patient Active Problem List   Diagnosis Date Noted  . Right insertional Achilles tendinitis 04/30/2020  . Right hand pain 04/30/2020  . Lumbar spinal stenosis 04/30/2020  . Irritable bowel syndrome with diarrhea 12/07/2019  . Depression, major, single episode, mild ([place])  12/07/2019  . Mild intermittent asthma without complication 16/03/9603  . Recurrent productive cough 08/31/2018  . Abnormal weight gain 07/05/2018  . Non-seasonal allergic rhinitis 07/05/2018  . Class 3 severe obesity  due to excess calories with serious comorbidity and body mass index (BMI) of 45.0 to 49.9 in adult ([place]) 07/05/2018  . Hypercalcemia 10/03/2017  . Forearm tendonitis 10/03/2017  . Secondary male hypogonadism 10/03/2017  . Hypothyroidism 10/01/2017  . Idiopathic peripheral neuropathy 10/01/2017  . Left distal biceps tendinopathy 10/01/2017    [place], PT 05/15/2020, 1:41 PM  [facility] [place], [place], [address] Phone: [phone]   Fax:  [phone]  Name: Garrett Watson MRN: [id number] Date of Birth: Jun 21, 1976

## 2020-05-15 NOTE — Patient Instructions (Signed)
Access Code: [email or web address] URL: [email or web address] Date: 05/15/2020 Prepared by: Rudell Cobb  Exercises Gastroc Stretch on Wall - 2 x daily - 7 x weekly - 1 sets - 2-3 reps - 30 seconds hold Single Leg Stance - 2 x daily - 7 x weekly - 1 sets - 3 reps - 10-15 seconds hold Seated Eccentric Ankle Plantar Flexion with Resistance - 2 x daily - 7 x weekly - 1 sets - 10 reps Standing Heel Raise - 2 x daily - 7 x weekly - 1 sets - 10-20 reps Toe Raises with Counter Support - 2 x daily - 7 x weekly - 1 sets - 10-20 reps Standing Hip Abduction - 2 x daily - 7 x weekly - 1 sets - 10 reps

## 2020-05-20 ENCOUNTER — Other Ambulatory Visit: Payer: Self-pay | Admitting: Osteopathic Medicine

## 2020-05-22 ENCOUNTER — Ambulatory Visit (INDEPENDENT_AMBULATORY_CARE_PROVIDER_SITE_OTHER): Payer: BC Managed Care – PPO | Admitting: Physical Therapy

## 2020-05-22 ENCOUNTER — Other Ambulatory Visit: Payer: Self-pay | Admitting: Orthopedic Surgery

## 2020-05-22 ENCOUNTER — Other Ambulatory Visit: Payer: Self-pay

## 2020-05-22 ENCOUNTER — Encounter: Payer: Self-pay | Admitting: Physical Therapy

## 2020-05-22 DIAGNOSIS — G959 Disease of spinal cord, unspecified: Secondary | ICD-10-CM

## 2020-05-22 DIAGNOSIS — M546 Pain in thoracic spine: Secondary | ICD-10-CM

## 2020-05-22 DIAGNOSIS — R29898 Other symptoms and signs involving the musculoskeletal system: Secondary | ICD-10-CM

## 2020-05-22 DIAGNOSIS — M25571 Pain in right ankle and joints of right foot: Secondary | ICD-10-CM

## 2020-05-22 DIAGNOSIS — M6281 Muscle weakness (generalized): Secondary | ICD-10-CM

## 2020-05-22 DIAGNOSIS — M542 Cervicalgia: Secondary | ICD-10-CM

## 2020-05-22 NOTE — Therapy (Addendum)
[place], [place], [address] Phone: [phone]   Fax:  [phone]  Physical Therapy Treatment and Discharge  Patient Details  Name: Garrett Watson MRN: [id number] Date of Birth: Apr 06, 1976 Referring Provider (PT): Aundria Mems, MD   Encounter Date: 05/22/2020   PT End of Session - 05/22/20 0849    Visit Number 3    Number of Visits 12    Date for PT Re-Evaluation 06/24/20    Authorization Type [employer] 30 visit/year    Authorization - Number of Visits 30    PT Start Time 0801    PT Stop Time 6720    PT Time Calculation (min) 42 min    Activity Tolerance Patient tolerated treatment well;No increased pain    Behavior During Therapy WFL for tasks assessed/performed           Past Medical History:  Diagnosis Date  . Allergy   . Erectile dysfunction   . Former cigarette smoker   . Hypogonadism male   . Hypokalemia   . Obesity   . Thyroid disease     Past Surgical History:  Procedure Laterality Date  . KNEE ARTHROSCOPY Right    2002, 2018  . OSTEOTOMY MAXILLARY      There were no vitals filed for this visit.   Subjective Assessment - 05/22/20 0801    Subjective I'm a little more sore since ending the steroids, I had to back off of the calf stretch as it was aggravating my ankle. Doing a lot of walking is still hard for me. I was sore after the DN but the next day soreness was gone.    Pertinent History hypothyroidism, obesity, lumbar stenosis, peripheral neuropathy.    Currently in Pain? No/denies                             [facility] Adult PT Treatment/Exercise - 05/22/20 0001      Manual Therapy   Manual Therapy Joint mobilization;Soft tissue mobilization;Other (comment)    Manual therapy comments separate from all other skilled intervention     Joint Mobilization foot inversion/eversion mobilization, ankle DF joint mobilization   Soft tissue  mobilization foam roller to gastroc/soleus     Other Manual Therapy cross friction massage achilles tendon       Ankle Exercises: Standing   Other Standing Ankle Exercises eccentric heel lowers half range x10  (limited by pain)   Other Standing Ankle Exercises SLS on blue foam pad 3x30 seconds R       Ankle Exercises: Seated   ABC's 2 reps    Ankle Circles/Pumps AROM;Right;15 reps    Other Seated Ankle Exercises eccentric gastroc with red band  x15     Ankle Exercises: Stretches   Other Stretch gastroc stretches x30 second in supine between ankle mobs                   PT Education - 05/22/20 0847    Education Details HEP modifications- removed one stretch that was aggravating, added eccentric strengthening in sitting with red TB    Person(s) Educated Patient    Methods Explanation;Demonstration    Comprehension Verbalized understanding;Returned demonstration               PT Long Term Goals - 05/10/20 1705      PT LONG TERM GOAL #1   Title The patient will be indep with HEP  for LE strength and flexibility.    Time 6    Period Weeks    Target Date 06/21/20      PT LONG TERM GOAL #2   Title The patient will reduce functional limitation per FOTO from 35% to < or equal to 25%.    Time 6    Period Weeks    Target Date 06/21/20      PT LONG TERM GOAL #3   Title The patient will report return to walking > 1 mile without R heel pain.    Time 6    Period Weeks    Target Date 06/21/20      PT LONG TERM GOAL #4   Title The patient will maintain R SLS x 10 seconds.    Time 6    Period Weeks    Target Date 06/21/20      PT LONG TERM GOAL #5   Title The patient will return to gym routine (as able due to other medical issues).    Time 6    Period Weeks    Target Date 06/21/20                 Plan - 05/22/20 0849    Clinical Impression Statement Garrett Watson arrives reporting that since he stopped the steroid dosing, his soreness has increased- still  using nitro patches at work and insoles in shoes. Discussed ice massage technique for home use. Spent quite a bit of time working on  manual techniques including cross friction massage, foam roller to gastric muscle belly, and ankle inversion/eversion and dorsiflexion joint mobs. Attempted standing eccentrics to R gastoc but with aggravation of pain so switched to seated therband eccentrics. Reports some aggravation using green TB on ankle last session. Also worked on general ankle mobility including ABCs and ankle circles for gross mobility. Might benefit from introduction of ice massage and/or  ionto next session if soreness/pain remains unchanged.    Personal Factors and Comorbidities Comorbidity 3+    Comorbidities obesity, lumbar stenosis, h/o R knee pain    Examination-Activity Limitations Lift;Locomotion Level;Squat;Stairs    Examination-Participation Restrictions Community Activity    Stability/Clinical Decision Making Stable/Uncomplicated    Clinical Decision Making Low    Rehab Potential Good    PT Duration 6 weeks    PT Treatment/Interventions ADLs/Self Care Home Management;Iontophoresis 52m/ml Dexamethasone;Moist Heat;Electrical Stimulation;Cryotherapy;Gait training;Stair training;Functional mobility training;Therapeutic activities;Therapeutic exercise;Balance training;Taping;Patient/family education;Manual techniques;Dry needling    PT Next Visit Plan progress loading to tolerance, DN for gastroc trigger points if needed or STM, slow progression for walking. Keep working on ankle joint mobs/manual and eccentric loading as tolerated. Consider ice massage vs ionto.    PT Home Exercise Plan Access Code: [email or web address]; added seated eccentric strength for R ankle with R TB (refused medbridge handout)    Consulted and Agree with Plan of Care Patient           Patient will benefit from skilled therapeutic intervention in order to improve the following deficits and impairments:  Pain, Hypomobility,  Impaired flexibility, Decreased strength, Increased fascial restricitons  Visit Diagnosis: Pain in right ankle and joints of right foot  Muscle weakness (generalized)  Other symptoms and signs involving the musculoskeletal system     Problem List Patient Active Problem List   Diagnosis Date Noted  . Right insertional Achilles tendinitis 04/30/2020  . Right hand pain 04/30/2020  . Lumbar spinal stenosis 04/30/2020  . Irritable bowel syndrome with diarrhea 12/07/2019  . Depression, major,  single episode, mild ([place]) 12/07/2019  . Mild intermittent asthma without complication 80/32/1224  . Recurrent productive cough 08/31/2018  . Abnormal weight gain 07/05/2018  . Non-seasonal allergic rhinitis 07/05/2018  . Class 3 severe obesity due to excess calories with serious comorbidity and body mass index (BMI) of 45.0 to 49.9 in adult ([place]) 07/05/2018  . Hypercalcemia 10/03/2017  . Forearm tendonitis 10/03/2017  . Secondary male hypogonadism 10/03/2017  . Hypothyroidism 10/01/2017  . Idiopathic peripheral neuropathy 10/01/2017  . Left distal biceps tendinopathy 10/01/2017   PHYSICAL THERAPY DISCHARGE SUMMARY  Visits from Start of Care: 3  Current functional level related to goals / functional outcomes: Not assessed due to pt not completing POC   Remaining deficits: See above   Education / Equipment: HEP Plan: Patient agrees to discharge.  Patient goals were not met. Patient is being discharged due to not returning since the last visit.  ?????    DONAWERTH,KAREN, [place], DPT, PN1   Supplemental Physical Therapist [facility]    Pager [phone] Acute Rehab Office [facility] [place], [place], [address] Phone: [phone]   Fax:  [phone]  Name: Garrett Watson MRN: [id number] Date of Birth: 1976/06/17

## 2020-05-23 ENCOUNTER — Encounter: Payer: Self-pay | Admitting: Physician Assistant

## 2020-05-23 ENCOUNTER — Other Ambulatory Visit: Payer: Self-pay | Admitting: Osteopathic Medicine

## 2020-05-23 ENCOUNTER — Ambulatory Visit: Payer: BC Managed Care – PPO | Admitting: Physician Assistant

## 2020-05-23 ENCOUNTER — Other Ambulatory Visit (INDEPENDENT_AMBULATORY_CARE_PROVIDER_SITE_OTHER): Payer: BC Managed Care – PPO

## 2020-05-23 VITALS — BP 124/76 | HR 86 | Ht 70.0 in | Wt 308.0 lb

## 2020-05-23 DIAGNOSIS — R142 Eructation: Secondary | ICD-10-CM

## 2020-05-23 DIAGNOSIS — R140 Abdominal distension (gaseous): Secondary | ICD-10-CM | POA: Diagnosis not present

## 2020-05-23 DIAGNOSIS — K760 Fatty (change of) liver, not elsewhere classified: Secondary | ICD-10-CM

## 2020-05-23 DIAGNOSIS — J3089 Other allergic rhinitis: Secondary | ICD-10-CM

## 2020-05-23 DIAGNOSIS — R1010 Upper abdominal pain, unspecified: Secondary | ICD-10-CM

## 2020-05-23 LAB — FERRITIN: Ferritin: 101.7 ng/mL (ref 22.0–322.0)

## 2020-05-23 MED ORDER — DICYCLOMINE HCL 20 MG PO TABS
ORAL_TABLET | ORAL | 3 refills | Status: DC
Start: 2020-05-23 — End: 2020-11-27

## 2020-05-23 MED ORDER — PANTOPRAZOLE SODIUM 40 MG PO TBEC: 40.0000 mg | DELAYED_RELEASE_TABLET | Freq: Every morning | ORAL | 11 refills | Status: DC

## 2020-05-23 NOTE — Patient Instructions (Addendum)
If you are age 43 or older, your body mass index should be between 23-30. Your Body mass index is 44.19 kg/m. If this is out of the aforementioned range listed, please consider follow up with your Primary Care Provider.  If you are age 27 or younger, your body mass index should be between 19-25. Your Body mass index is 44.19 kg/m. If this is out of the aformentioned range listed, please consider follow up with your Primary Care Provider.   Your provider has requested that you go to the basement level for lab work before leaving today. Press "B" on the elevator. The lab is located at the first door on the left as you exit the elevator.  START Pantoprazole 40 mg 1 tablet every morning before breakfast Continue Dicyclomine 10 mg 1 tablet 3-4 times a day  Call the off in 1 week and ask for Beth, Amy's nurse, to give an update and discuss scheduling an Endoscopy and CT.  Thank you for entrusting me with your care and choosing [facility].  Amy Esterwood, PA-C

## 2020-05-23 NOTE — Progress Notes (Signed)
Subjective:    Patient ID: Garrett Watson, male    DOB: [birth year], 44 y.o.   MRN: [id number]  HPI  Garrett Watson is a pleasant 44 year old white male, new to GI today referred by Emeterio Reeve, DO, with probable nonalcoholic fatty liver disease, and IBS type symptoms. Patient has not had any prior GI evaluation. He has history of morbid obesity and hypothyroidism. He says that he has always had a sensitive stomach and has reacted to certain foods.  However over the past year or so he has been having more consistent symptoms.  He had some oily stools back in February which have since resolved.  In June 2021 he was started on a trial of dicyclomine per his PCP and says that has not been beneficial.  What he describes is a lot of belching and burping, and fullness and bloating in the upper abdomen particularly as well as some tenderness across the upper abdomen.  He describes it as a sensation of feeling a lot of pressure in his upper abdomen.  He has had some intermittent heartburn but not on a regular basis.  He describes a lot of belching and gas postprandially.  He has been having 2-3 bowel movements per day sometimes loose but not overt diarrhea.  He did have what he describes as a very dark or black stool a couple of weeks ago, that has not recurred.  He generally does not notice any melena or hematochezia. Is not on any regular aspirin or NSAIDs. He had been given a trial of a FODMAP type regimen and says he did not notice any particular response to avoidance of any categories though he generally limits his lactose. He also relates some intermittent solid food dysphagia.  No episodes requiring regurgitation but has to stop eating at times to allow food to traverse. Labs from June 2021 triglycerides 169/cholesterol 189/LDL 122 and LFTs within normal limits.  Recent upper abdominal ultrasound showed hepatic steatosis with some slight irregularity of the hepatic surface raising question of  early cirrhosis, portal vein patent. Patient does not drink alcohol, he does have family history of liver disease in his brothers but that was alcohol related. Patient says he used to weigh 550 pounds in 2017.  He has had a 140 pound weight loss since that time intentionally and thinks he has lost almost 30 pounds within the past month or so.  He is very much trying to limit his risk factors, is eating primarily a plant-based diet at this point and exercises on a regular basis. He also mentions that he is undergoing work-up currently for spinal stenosis with some neuropathy type symptoms and has upcoming MRI scheduled.  He thinks he may wind up requiring surgery.  Review of Systems Pertinent positive and negative review of systems were noted in the above HPI section.  All other review of systems was otherwise negative.  Outpatient Encounter Medications as of 05/23/2020  Medication Sig  . Azelastine-Fluticasone 137-50 MCG/ACT SUSP One spray each nostril BID  . buPROPion (WELLBUTRIN XL) 150 MG 24 hr tablet TAKE 1 TABLET BY MOUTH EVERY DAY IN THE MORNING  . cetirizine (ZYRTEC) 10 MG tablet Take 1 tablet (10 mg total) by mouth daily. Can increase to twice daily during allergy season  . dicyclomine (BENTYL) 20 MG tablet Take 1 tablet three to four times daily  . fluticasone (FLONASE) 50 MCG/ACT nasal spray Place 1 spray into both nostrils daily.  Mar[place] Kitchen levothyroxine (SYNTHROID) 25 MCG tablet TAKE 1 TABLET  BY MOUTH DAILY BEFORE BREAKFAST.  . montelukast (SINGULAIR) 10 MG tablet Take 1 tablet (10 mg total) by mouth at bedtime.  . Multiple Vitamins-Minerals (CENTRUM ADULTS PO) Take by mouth.  . nitroGLYCERIN (NITRODUR - DOSED IN MG/24 HR) 0.2 mg/hr patch Cut and apply 1/4 patch to most painful area q24h.  . NONFORMULARY OR COMPOUNDED ITEM B-NOX Pre-workout formula  . SYMBICORT 160-4.5 MCG/ACT inhaler Inhale 2 puffs into the lungs 2 (two) times daily.  . [DISCONTINUED] dicyclomine (BENTYL) 20 MG tablet TAKE 1  TABLET 4 TIMES DAILY BEFORE MEALS AND AT BEDTIME. AS NEEDED FOR ABDOMINAL CRAMPS/LOOSE STOOL  . pantoprazole (PROTONIX) 40 MG tablet Take 1 tablet (40 mg total) by mouth in the morning.  . pregabalin (LYRICA) 75 MG capsule Take 1 capsule (75 mg total) by mouth 3 (three) times daily.  . [DISCONTINUED] albuterol (VENTOLIN HFA) 108 (90 Base) MCG/ACT inhaler Inhale 2 puffs into the lungs every 6 (six) hours as needed for wheezing.  . [DISCONTINUED] B Complex Vitamins (B COMPLEX 100 PO) Take by mouth.  . [DISCONTINUED] dexamethasone (DECADRON) 4 MG tablet Take 1 tablet (4 mg total) by mouth 3 (three) times daily.  . [DISCONTINUED] Green Tea 150 MG CAPS Take by mouth.   . [DISCONTINUED] Lactobacillus-Inulin ([place]) Take by mouth.   . [DISCONTINUED] Omega-3 Fatty Acids (FISH OIL) 1000 MG CAPS Take by mouth.    No facility-administered encounter medications on file as of 05/23/2020.   Allergies  Allergen Reactions  . Other Cough    Patient reports reaction was when he was a baby   . Robitussin 12 Hour Cough [Dextromethorphan Polistirex Er]   . Phentermine Other (See Comments)    Irritability, increased heart rate   Patient Active Problem List   Diagnosis Date Noted  . Right insertional Achilles tendinitis 04/30/2020  . Right hand pain 04/30/2020  . Lumbar spinal stenosis 04/30/2020  . Irritable bowel syndrome with diarrhea 12/07/2019  . Depression, major, single episode, mild ([place]) 12/07/2019  . Mild intermittent asthma without complication 04/54/0981  . Recurrent productive cough 08/31/2018  . Abnormal weight gain 07/05/2018  . Non-seasonal allergic rhinitis 07/05/2018  . Class 3 severe obesity due to excess calories with serious comorbidity and body mass index (BMI) of 45.0 to 49.9 in adult ([place]) 07/05/2018  . Hypercalcemia 10/03/2017  . Forearm tendonitis 10/03/2017  . Secondary male hypogonadism 10/03/2017  . Hypothyroidism 10/01/2017  . Idiopathic peripheral  neuropathy 10/01/2017  . Left distal biceps tendinopathy 10/01/2017   Social History   Socioeconomic History  . Marital status: Single    Spouse name: Not on file  . Number of children: Not on file  . Years of education: Not on file  . Highest education level: Not on file  Occupational History  . Not on file  Tobacco Use  . Smoking status: Former Smoker    Types: Cigarettes    Quit date: 08/12/2015    Years since quitting: 4.7  . Smokeless tobacco: Never Used  Vaping Use  . Vaping Use: Former  Substance and Sexual Activity  . Alcohol use: Yes    Comment: rarely  . Drug use: Yes    Types: Marijuana    Comment: 2 to 3 x a week for pain  . Sexual activity: Not Currently  Other Topics Concern  . Not on file  Social History Narrative  . Not on file   Social Determinants of Health   Financial Resource Strain:   . Difficulty of Paying  Living Expenses: Not on file  Food Insecurity:   . Worried About Charity fundraiser in the Last Year: Not on file  . Ran Out of Food in the Last Year: Not on file  Transportation Needs:   . Lack of Transportation (Medical): Not on file  . Lack of Transportation (Non-Medical): Not on file  Physical Activity:   . Days of Exercise per Week: Not on file  . Minutes of Exercise per Session: Not on file  Stress:   . Feeling of Stress : Not on file  Social Connections:   . Frequency of Communication with Friends and Family: Not on file  . Frequency of Social Gatherings with Friends and Family: Not on file  . Attends Religious Services: Not on file  . Active Member of Clubs or Organizations: Not on file  . Attends Archivist Meetings: Not on file  . Marital Status: Not on file  Intimate Partner Violence:   . Fear of Current or Ex-Partner: Not on file  . Emotionally Abused: Not on file  . Physically Abused: Not on file  . Sexually Abused: Not on file    Mr. Garrett Watson family history includes Alcohol abuse in his brother, brother,  and father; Depression in his cousin; Diabetes in his cousin, paternal aunt, and paternal uncle; Hyperlipidemia in his father; Hypertension in his father; Kidney cancer in his maternal grandmother; Lung cancer in his maternal grandfather; Stroke in his father and paternal aunt.      Objective:    Vitals:   05/23/20 0821  BP: 124/76  Pulse: 86  SpO2: 90%    Physical Exam Well-developed well-nourished WM in no acute distress.  Height, Weight, 308 BMI 44.1  HEENT; nontraumatic normocephalic, EOMI, PE RR LA, sclera anicteric. Oropharynx;not examined Neck; supple, no JVD Cardiovascular; regular rate and rhythm with S1-S2, no murmur rub or gallop Pulmonary; Clear bilaterally Abdomen; soft,obese,  Mild tenderness upper abdomen nondistended, no palpable mass or hepatosplenomegaly, bowel sounds are active Rectal;not done Skin; benign exam, no jaundice rash or appreciable lesions Extremities; no clubbing cyanosis or edema skin warm and dry Neuro/Psych; alert and oriented x4, grossly nonfocal mood and affect appropriate       Assessment & Plan:   #3 44 year old white male with significant hepatic steatosis noted on recent ultrasound and slight irregularity of the hepatic surface raising question of early cirrhosis. Normal LFTs June 2021. Patient has history of severe morbid obesity with weight of 550 pounds as of 2017.  He has since lost 140 pounds intentionally. Suspect he does have NAFLD, rule out fibrosis versus early cirrhosis. Will rule out underlying chronic viral liver disease, and autoimmune, inheritable liver diseases.  #2 several month history of upper abdominal fullness bloating gas belching and abdominal pressure/discomfort.  This has been associated with increased frequency of bowel movements 2 to 3/day.  Etiology not clear, this may be secondary to IBS, rule out functional dyspepsia GERD, rule out chronic gastropathy, rule out intra-abdominal inflammatory process, no ascites  noted on recent ultrasound, rule out neoplasm.  #3  Hypothyroidism #4 Spinal stenosis, work-up in progress  Plan; schedule for CT scan of the abdomen and pelvis with contrast. Patient will be scheduled for upper endoscopy with Dr. Bryan Lemma.  Procedure was discussed in detail with the patient including indications risks and benefits and he is agreeable to proceed. He has completed COVID-19 vaccination. Start Protonix 40 mg p.o. every morning AC breakfast In the short-term will continue dicyclomine 20 mg p.o. 3  times daily.  Check hepatitis B and C serologies, TTG and IgA Ferritin, alpha-1 antitrypsin, ceruloplasmin, ANA, smooth muscle antibody and antimitochondrial antibody. Patient was commended for his significant efforts towards weight loss, and advised to continue gradual weight loss,and regular exercise  Depending on results of CT he may need [place] with elastography Further recommendations pending results of above, patient will be established with Dr. Bryan Lemma.   Garrett Watson Genia Harold PA-C 05/23/2020   Cc: Emeterio Reeve, DO

## 2020-05-23 NOTE — Telephone Encounter (Signed)
They called patient on 05/01/20 to schedule and left a voicemail. Patient is scheduled in December for new patient appointment - cf

## 2020-05-24 ENCOUNTER — Encounter: Payer: BC Managed Care – PPO | Admitting: Rehabilitative and Restorative Service Providers"

## 2020-05-25 ENCOUNTER — Ambulatory Visit (HOSPITAL_COMMUNITY)
Admission: RE | Admit: 2020-05-25 | Discharge: 2020-05-25 | Disposition: A | Discharge status: Home / Self Care | Payer: BC Managed Care – PPO | Source: Ambulatory Visit | Attending: Orthopedic Surgery | Admitting: Orthopedic Surgery

## 2020-05-25 ENCOUNTER — Other Ambulatory Visit: Payer: Self-pay

## 2020-05-25 DIAGNOSIS — G959 Disease of spinal cord, unspecified: Secondary | ICD-10-CM

## 2020-05-25 DIAGNOSIS — M546 Pain in thoracic spine: Secondary | ICD-10-CM | POA: Insufficient documentation

## 2020-05-25 DIAGNOSIS — M542 Cervicalgia: Secondary | ICD-10-CM | POA: Insufficient documentation

## 2020-05-26 LAB — TISSUE TRANSGLUTAMINASE, IGA: (tTG) Ab, IgA: <1 U/mL

## 2020-05-26 LAB — CERULOPLASMIN: Ceruloplasmin: 31 mg/dL (ref 18–36)

## 2020-05-26 LAB — ANTI-SMOOTH MUSCLE ANTIBODY, IGG: Actin (Smooth Muscle) Antibody (IGG): <20 U (ref <20)

## 2020-05-26 LAB — HEPATITIS B SURFACE ANTIGEN: Hepatitis B Surface Ag: NONREACTIVE

## 2020-05-26 LAB — MITOCHONDRIAL ANTIBODIES: Mitochondrial M2 Ab, IgG: <=20 U

## 2020-05-26 LAB — HEPATITIS B SURFACE ANTIBODY,QUALITATIVE: Hep B S Ab: NONREACTIVE

## 2020-05-26 LAB — IGA: Immunoglobulin A: 118 mg/dL (ref 47–310)

## 2020-05-26 LAB — ALPHA-1-ANTITRYPSIN: A-1 Antitrypsin, Ser: 161 mg/dL (ref 83–199)

## 2020-05-26 LAB — ANA: Anti Nuclear Antibody (ANA): NEGATIVE

## 2020-05-26 LAB — HEPATITIS C ANTIBODY
Hepatitis C Ab: NONREACTIVE
SIGNAL TO CUT-OFF: 0.06 (ref <1.00)

## 2020-05-30 NOTE — Progress Notes (Signed)
Agree with the assessment and plan as outlined by Amy Esterwood, PA-C.  Renay Crammer, DO, FACG  

## 2020-06-11 ENCOUNTER — Other Ambulatory Visit: Payer: Self-pay

## 2020-06-11 ENCOUNTER — Ambulatory Visit (INDEPENDENT_AMBULATORY_CARE_PROVIDER_SITE_OTHER): Payer: BC Managed Care – PPO

## 2020-06-11 ENCOUNTER — Ambulatory Visit (INDEPENDENT_AMBULATORY_CARE_PROVIDER_SITE_OTHER): Payer: BC Managed Care – PPO | Admitting: Sports Medicine

## 2020-06-11 DIAGNOSIS — M25562 Pain in left knee: Secondary | ICD-10-CM

## 2020-06-11 DIAGNOSIS — M7661 Achilles tendinitis, right leg: Secondary | ICD-10-CM

## 2020-06-11 DIAGNOSIS — M67922 Unspecified disorder of synovium and tendon, left upper arm: Secondary | ICD-10-CM

## 2020-06-11 DIAGNOSIS — M1712 Unilateral primary osteoarthritis, left knee: Secondary | ICD-10-CM | POA: Insufficient documentation

## 2020-06-11 NOTE — Assessment & Plan Note (Signed)
Suspect osteoarthritis with meniscal tearing, we are going to get x-rays, and hope for some systemic effect from the elbow injection. Rehab exercises given, return to see me in a month, MRI/injection if no better.

## 2020-06-11 NOTE — Assessment & Plan Note (Signed)
Right Achilles insertion, nitroglycerin is providing excellent improvement. Continue treatment for at least another month.

## 2020-06-11 NOTE — Assessment & Plan Note (Signed)
Good improvement with distal biceps injection in 2019, increasing pain, repeat left distal biceps injection today.

## 2020-06-11 NOTE — Progress Notes (Signed)
    Procedures performed today:    Procedure: Real-time Ultrasound Guided injection of the left distal biceps Device: Samsung HS60  Verbal informed consent obtained.  Time-out conducted.  Noted no overlying erythema, induration, or other signs of local infection.  Skin prepped in a sterile fashion.  Local anesthesia: Topical Ethyl chloride.  With sterile technique and under real time ultrasound guidance: 1 cc Kenalog 40, 1 cc lidocaine, 1 cc bupivacaine injected easily Completed without difficulty  Advised to call if fevers/chills, erythema, induration, drainage, or persistent bleeding.  Images permanently stored and available for review in PACS.  Impression: Technically successful ultrasound guided injection.  Independent interpretation of notes and tests performed by another provider:   None.  Brief History, Exam, Impression, and Recommendations:    Left distal biceps tendinopathy Good improvement with distal biceps injection in 2019, increasing pain, repeat left distal biceps injection today.  Left knee pain Suspect osteoarthritis with meniscal tearing, we are going to get x-rays, and hope for some systemic effect from the elbow injection. Rehab exercises given, return to see me in a month, MRI/injection if no better.  Right insertional Achilles tendinitis Right Achilles insertion, nitroglycerin is providing excellent improvement. Continue treatment for at least another month.    ___________________________________________ Gwen Her. Dianah Field, M.D., ABFM., CAQSM. Primary Care and [place] Instructor of [place] of [facility] of Medicine

## 2020-06-12 ENCOUNTER — Other Ambulatory Visit: Payer: Self-pay

## 2020-06-26 ENCOUNTER — Other Ambulatory Visit: Payer: Self-pay | Admitting: Osteopathic Medicine

## 2020-06-26 DIAGNOSIS — E038 Other specified hypothyroidism: Secondary | ICD-10-CM

## 2020-07-09 ENCOUNTER — Other Ambulatory Visit: Payer: Self-pay

## 2020-07-09 ENCOUNTER — Ambulatory Visit (INDEPENDENT_AMBULATORY_CARE_PROVIDER_SITE_OTHER): Payer: BC Managed Care – PPO | Admitting: Sports Medicine

## 2020-07-09 DIAGNOSIS — M25562 Pain in left knee: Secondary | ICD-10-CM | POA: Diagnosis not present

## 2020-07-09 DIAGNOSIS — M67922 Unspecified disorder of synovium and tendon, left upper arm: Secondary | ICD-10-CM

## 2020-07-09 NOTE — Assessment & Plan Note (Signed)
Good improvement with distal biceps sheath injection at the last visit, return as needed for this.

## 2020-07-09 NOTE — Progress Notes (Signed)
    Procedures performed today:    None.  Independent interpretation of notes and tests performed by another provider:   None.  Brief History, Exam, Impression, and Recommendations:    Left distal biceps tendinopathy Good improvement with distal biceps sheath injection at the last visit, return as needed for this.  Left knee pain Gerald Stabs was having left-sided knee pain, I suspected osteoarthritis with meniscal tearing, he also did well with the systemic effect from his elbow injection.    ___________________________________________ Gwen Her. Dianah Field, M.D., ABFM., CAQSM. Primary Care and [place] Instructor of [place] of [facility] of Medicine

## 2020-07-09 NOTE — Assessment & Plan Note (Signed)
Garrett Watson was having left-sided knee pain, I suspected osteoarthritis with meniscal tearing, he also did well with the systemic effect from his elbow injection.

## 2020-08-14 ENCOUNTER — Other Ambulatory Visit: Payer: Self-pay | Admitting: Osteopathic Medicine

## 2020-08-14 ENCOUNTER — Encounter: Payer: Self-pay | Admitting: Osteopathic Medicine

## 2020-08-14 DIAGNOSIS — G609 Hereditary and idiopathic neuropathy, unspecified: Secondary | ICD-10-CM

## 2020-08-15 MED ORDER — PREGABALIN 75 MG PO CAPS: 75.0000 mg | ORAL_CAPSULE | Freq: Three times a day (TID) | ORAL | 3 refills | Status: DC

## 2020-08-15 NOTE — Telephone Encounter (Signed)
Routing to covering provider. Rx pended.  

## 2020-08-30 ENCOUNTER — Other Ambulatory Visit: Payer: Self-pay | Admitting: Osteopathic Medicine

## 2020-09-24 ENCOUNTER — Telehealth: Payer: Self-pay | Admitting: Physician Assistant

## 2020-09-24 NOTE — Telephone Encounter (Signed)
Inbound call from patient requesting a call back please to schedule CT scan.

## 2020-09-24 NOTE — Telephone Encounter (Signed)
Left message for patient to call back  

## 2020-10-01 ENCOUNTER — Other Ambulatory Visit: Payer: Self-pay

## 2020-10-01 DIAGNOSIS — K760 Fatty (change of) liver, not elsewhere classified: Secondary | ICD-10-CM

## 2020-10-01 DIAGNOSIS — R1084 Generalized abdominal pain: Secondary | ICD-10-CM

## 2020-10-01 DIAGNOSIS — R197 Diarrhea, unspecified: Secondary | ICD-10-CM

## 2020-10-01 NOTE — Telephone Encounter (Signed)
Called the patient. No answer. Left a message on his voicemail. Order for CT scan sent to [facility]. He should expect to be contacted by them in the next 72 hours. I believe they have evening hours available, but I am not certain. Asked he call me if he is interested in pursuing the endoscopy procedures.

## 2020-10-01 NOTE — Telephone Encounter (Signed)
Per review of my office note from December 2021 he was supposed to be scheduled for CT of the abdomen pelvis with contrast for further evaluation of severe hepatic steatosis, and upper abdominal pain/fullness.  Please get him scheduled for CT of the abdomen and pelvis with contrast.   He was also supposed to be scheduled for upper endoscopy with Dr. Bryan Lemma for complaints of persistent belching burping and fullness.  If patient would like to also proceed with EGD and if having persistent symptoms then go ahead and get him scheduled for EGD with Dr. Bryan Lemma.  Thank you

## 2020-10-01 NOTE — Telephone Encounter (Signed)
Amy This patient was lost to follow up for further testing. Please advise on how to proceed.  Thanks

## 2020-10-02 NOTE — Telephone Encounter (Signed)
Message sent to the patient through My Chart. 

## 2020-10-14 ENCOUNTER — Ambulatory Visit (INDEPENDENT_AMBULATORY_CARE_PROVIDER_SITE_OTHER): Payer: BC Managed Care – PPO

## 2020-10-14 ENCOUNTER — Ambulatory Visit (INDEPENDENT_AMBULATORY_CARE_PROVIDER_SITE_OTHER): Payer: BC Managed Care – PPO | Admitting: Sports Medicine

## 2020-10-14 ENCOUNTER — Other Ambulatory Visit: Payer: Self-pay

## 2020-10-14 DIAGNOSIS — M67922 Unspecified disorder of synovium and tendon, left upper arm: Secondary | ICD-10-CM

## 2020-10-14 DIAGNOSIS — Z981 Arthrodesis status: Secondary | ICD-10-CM | POA: Diagnosis not present

## 2020-10-14 DIAGNOSIS — G8929 Other chronic pain: Secondary | ICD-10-CM

## 2020-10-14 DIAGNOSIS — M25522 Pain in left elbow: Secondary | ICD-10-CM

## 2020-10-14 DIAGNOSIS — M1712 Unilateral primary osteoarthritis, left knee: Secondary | ICD-10-CM

## 2020-10-14 NOTE — Assessment & Plan Note (Signed)
Overall doing well.

## 2020-10-14 NOTE — Assessment & Plan Note (Signed)
Having recurrence of discomfort, x-rays did show osteoarthritis. Injected today, if insufficient improvement we will proceed with MRI +/- referral to arthroscopy. Return to see me in 4 to 6 weeks.

## 2020-10-14 NOTE — Assessment & Plan Note (Signed)
Garrett Watson did really well after a distal biceps sheath injection back in December of last year, he also had neck surgery, 4 weeks later he is still having some weakness in his left arm, a little bit of pain referable to the distal biceps. I do think he needs to give this more time post surgery to get his strength back, however he has ultimately failed greater than 6 weeks of physician directed conservative treatment including NSAIDs and steroid injections with regards to his left elbow so we will also proceed with x-ray and MRI of the left elbow. This is in anticipation of PRP peritendinous injection around the distal biceps.

## 2020-10-14 NOTE — Progress Notes (Signed)
    Procedures performed today:    Procedure: Real-time Ultrasound Guided injection of the left knee  Device: Samsung HS60  Verbal informed consent obtained.  Time-out conducted.  Noted no overlying erythema, induration, or other signs of local infection.  Skin prepped in a sterile fashion.  Local anesthesia: Topical Ethyl chloride.  With sterile technique and under real time ultrasound guidance:  No effusion noted, 1 cc Kenalog 40, 2 cc lidocaine, 2 cc bupivacaine injected easily Completed without difficulty  Advised to call if fevers/chills, erythema, induration, drainage, or persistent bleeding.  Images permanently stored and available for review in PACS.  Impression: Technically successful ultrasound guided injection.  Independent interpretation of notes and tests performed by another provider:   None.  Brief History, Exam, Impression, and Recommendations:    Primary osteoarthritis of left knee Having recurrence of discomfort, x-rays did show osteoarthritis. Injected today, if insufficient improvement we will proceed with MRI +/- referral to arthroscopy. Return to see me in 4 to 6 weeks.  Left distal biceps tendinopathy Gerald Stabs did really well after a distal biceps sheath injection back in December of last year, he also had neck surgery, 4 weeks later he is still having some weakness in his left arm, a little bit of pain referable to the distal biceps. I do think he needs to give this more time post surgery to get his strength back, however he has ultimately failed greater than 6 weeks of physician directed conservative treatment including NSAIDs and steroid injections with regards to his left elbow so we will also proceed with x-ray and MRI of the left elbow. This is in anticipation of PRP peritendinous injection around the distal biceps.  History of C5-C6 ACDF February 2022 Overall doing well.    ___________________________________________ Gwen Her. Dianah Field, M.D.,  ABFM., CAQSM. Primary Care and [place] Instructor of [place] of [facility] [employer]

## 2020-10-14 NOTE — Patient Instructions (Signed)
? ?Platelet-rich plasma is used in musculoskeletal medicine to focus your own body?s ability to ?heal. It has several well-done published randomized control trials (RCT) which demonstrate both ?its effectiveness and safety in many musculoskeletal conditions, including osteoarthritis, ?tendinopathies, and damaged vertebral discs. PRP has been in clinical use since the 1990?s. ?Many people know that platelets form a clot if there is a cut in the skin. It turns out that ?platelets do not only form a clot, they also start the body?s own repair process. When platelets ?activate to form a clot, they also release alpha granules which have hundreds of chemical ?messengers in them that initiate and organize repair to the damaged tissue. Precisely placing ?PRP into the site of injury will initiate the healing process by activating on the damaged ?cartilage or tendon. This is an inflammatory process, and inflammation is the vital first phase of ?Healing. ? ?What to expect and how to prepare for PRP ? ? 2 weeks prior to the procedure: depending on the procedure, you may need to arrange ?for a driver to bring you home. IF you are having a lower extremity procedure, we can provide crutches ?as needed. ? ? 7 days prior to the procedure: Stop taking anti-inflammatory drugs like ibuprofen, ?Naprosyn, Celebrex, or Meloxicam. Let Dr. Gianmarco Roye know if you have been taking prednisone or other ?corticosteroids in the last month. ? ? The day before the procedure: thoroughly shower and clean your skin.  ? ? The day of the procedure: Wear loose-fitting clothing like sweatpants or shorts. If you ?are having an upper body procedure wear a top that can button or zip up. ? ?PRP will initiate healing and a productive inflammation, and PRP therapy will make the body ?part treated sore for 4 days to two weeks. Anti-inflammatory drugs (i.e. ibuprofen, Naprosyn, ?Celebrex) and corticosteroids such as prednisone can blunt or stop this process,  so it is ?important to not take any anti-inflammatory drugs for 7 days before getting PRP therapy, or for ?at least three weeks after PRP therapy. Corticosteroid injections can blunt inflammation for 30 ?days, so let us know if you have had one recently. Depending on the body part injected, you ?may be in a sling or on crutches for several days. Just like wringing out a wet dishcloth, if you ?load or tense a tendon or ligament that has just been injected with PRP, some of the PRP ?injected will squish out. By keeping the body part treated relaxed by using a sling (for the ?shoulder or arm) or crutches (for hips and legs) for a few days, the PRP can bind in place and do ?its job.  ? ?You may need a driver to bring you home.  ?Tobacco/nicotine is a potent toxin and its use constricts small blood vessels which are needed for tissue repair.  ?Tobacco/nicotine use will limit the effectiveness of any treatment and stopping tobacco use is one of the single  ?greatest actions you can take to improve your health. Avoid toxins like alcohol, which inhibits and depresses the ?cells needed for tissue repair. ? ?What happens during the PRP procedure? ? ?Platelet rich plasma is made by taking some of your blood and performing a two-stage ?centrifuge process on it to concentrate the PRP. First, your blood is drawn into a syringe with a ?small amount of anti-coagulant in it (this is to keep the blood from clotting during this process). ?The amount of blood drawn is usually about 10-30 milliliters, depending on how much PRP is needed for   the treatment.  ?(There are 355 milliliters in a 12-ounce soda can for comparison).  ?Then the blood is transferred in a sterile fashion into a ?centrifuge tube. It is then centrifuged for the first cycle where the red blood cells are isolated ?and discarded. In the second centrifuge cycle, the platelet-rich fraction of the remaining plasma ?is concentrated and placed in a syringe. The skin at the  injection site is numbed with a small ?amount of topical cooling spray. Dr Manvir Thorson will then precisely inject the PRP ?into the injury site using ultrasound guidance. ? ?What to do after your procedure ? ?I will give you specific medicine to control any discomfort you may have after the procedure. ?Avoid NSAIDs like ibuprofen. ?Acetaminophen can be used for mild pain.  ?Depending on the part of the body ?treated, usually you will be placed in a sling or on crutches for 1 to 3 days. Do your best not to ?tense or load the treated area during this time. After 3 days, unless otherwise instructed, the ?treated body part should be used and slowly moved through its full range of motion. It will be ?sore, but you will not be doing damage by moving it, in fact it needs to move to heal. If you ?were on crutches for a period of time, walking is ok once you are off the crutches. For now, ?avoid activities that specifically hurt you before being treated. Exercise is vital to good health ?and finding a way to cross train around your injury is important not only for your physical ?health, but for your mental health as well. Ask me about cross training options for your injury. ?Some brief (10 minutes or less) period of heat or ice therapy will not hurt the therapy, but it is ?not required. Usually, depending on the initial injury, physical therapy is started from two ?weeks to four weeks after injection. Improvements in pain and function should be expected ?from 8 weeks to 12 weeks after injection and some injuries may require more than one ?treatment.  ? ? ?___________________________________________ ?Marialuiza Car J. Dimitra [place], M.D., ABFM., CAQSM. ?Primary Care and Sports Medicine ?[facility] ? ?Adjunct Professor of Family Medicine  ?[employer] ? ?

## 2020-10-16 ENCOUNTER — Ambulatory Visit
Admission: RE | Admit: 2020-10-16 | Discharge: 2020-10-16 | Disposition: A | Discharge status: Home / Self Care | Payer: BC Managed Care – PPO | Source: Ambulatory Visit | Attending: Physician Assistant | Admitting: Physician Assistant

## 2020-10-16 DIAGNOSIS — K760 Fatty (change of) liver, not elsewhere classified: Secondary | ICD-10-CM

## 2020-10-16 DIAGNOSIS — R1084 Generalized abdominal pain: Secondary | ICD-10-CM

## 2020-10-16 DIAGNOSIS — R197 Diarrhea, unspecified: Secondary | ICD-10-CM

## 2020-10-16 MED ORDER — IOPAMIDOL (ISOVUE-300) INJECTION 61%
100.0000 mL | Freq: Once | INTRAVENOUS | Status: AC | PRN
  Administered 2020-10-16: 100 mL via INTRAVENOUS

## 2020-11-05 ENCOUNTER — Other Ambulatory Visit: Payer: Self-pay

## 2020-11-05 ENCOUNTER — Ambulatory Visit (AMBULATORY_SURGERY_CENTER): Payer: BC Managed Care – PPO | Admitting: *Deleted

## 2020-11-05 VITALS — Ht 70.0 in | Wt 290.0 lb

## 2020-11-05 DIAGNOSIS — R142 Eructation: Secondary | ICD-10-CM

## 2020-11-05 DIAGNOSIS — R140 Abdominal distension (gaseous): Secondary | ICD-10-CM

## 2020-11-05 NOTE — Progress Notes (Signed)
Patient is here in-person for PV. Patient denies any allergies to eggs or soy. Patient denies any problems with anesthesia/sedation. Patient denies any oxygen use at home. Patient denies taking any diet/weight loss medications or blood thinners. Patient is not being treated for aware of our care-partner policy and UPJSR-15 safety protocol.  MRSA or C-diff. Patient isEMMI education assigned to the patient for the procedure, sent to MyChart.   Patient is COVID-19 vaccinated, per patient.   PER PT HE HAS CLEARANCE FROM HIS SURGEON FROM NECK FUSION C5-C6 FROM FEB 2022 1 MONTH AGO.

## 2020-11-25 ENCOUNTER — Telehealth: Payer: Self-pay | Admitting: Sports Medicine

## 2020-11-25 ENCOUNTER — Other Ambulatory Visit: Payer: Self-pay

## 2020-11-25 ENCOUNTER — Ambulatory Visit (INDEPENDENT_AMBULATORY_CARE_PROVIDER_SITE_OTHER): Payer: BC Managed Care – PPO | Admitting: Sports Medicine

## 2020-11-25 ENCOUNTER — Other Ambulatory Visit: Payer: Self-pay | Admitting: Osteopathic Medicine

## 2020-11-25 ENCOUNTER — Ambulatory Visit (INDEPENDENT_AMBULATORY_CARE_PROVIDER_SITE_OTHER): Payer: BC Managed Care – PPO

## 2020-11-25 ENCOUNTER — Telehealth: Payer: Self-pay

## 2020-11-25 DIAGNOSIS — R200 Anesthesia of skin: Secondary | ICD-10-CM

## 2020-11-25 DIAGNOSIS — M19022 Primary osteoarthritis, left elbow: Secondary | ICD-10-CM

## 2020-11-25 DIAGNOSIS — M1712 Unilateral primary osteoarthritis, left knee: Secondary | ICD-10-CM

## 2020-11-25 DIAGNOSIS — R058 Other specified cough: Secondary | ICD-10-CM

## 2020-11-25 DIAGNOSIS — R202 Paresthesia of skin: Secondary | ICD-10-CM

## 2020-11-25 DIAGNOSIS — E038 Other specified hypothyroidism: Secondary | ICD-10-CM

## 2020-11-25 NOTE — Assessment & Plan Note (Signed)
Did not respond to injection at the last visit, getting him approved for viscosupplementation. Return to start Orthovisc or other Visco supplement when approved.

## 2020-11-25 NOTE — Assessment & Plan Note (Signed)
Initially suspected left elbow distal biceps tinnitus, MRI ultimately confirmed elbow osteoarthritis after failure of distal biceps sheath injection. Today we injected his left elbow joint, return to see me in a month for this.

## 2020-11-25 NOTE — Assessment & Plan Note (Signed)
Garrett Watson is doing well post cervical ACDF, he is having some leg weakness, numbness and tingling, he had x-rays at [facility] that were for the most part unremarkable, he likely has some degree of lumbar spinal stenosis or DDD, adding formal physical therapy, if failure we will proceed with MRI.

## 2020-11-25 NOTE — Progress Notes (Signed)
    Procedures performed today:    Procedure: Real-time Ultrasound Guided injection of the left elbow joint Device: Samsung HS60  Verbal informed consent obtained.  Time-out conducted.  Noted no overlying erythema, induration, or other signs of local infection.  Skin prepped in a sterile fashion.  Local anesthesia: Topical Ethyl chloride.  With sterile technique and under real time ultrasound guidance:  Noted arthritic elbow joint, I advanced through the anconeus muscle, 1 cc Kenalog 40, 1 cc lidocaine, 1 cc bupivacaine injected easily Completed without difficulty  Advised to call if fevers/chills, erythema, induration, drainage, or persistent bleeding.  Images permanently stored and available for review in PACS.  Impression: Technically successful ultrasound guided injection.  Independent interpretation of notes and tests performed by another provider:   None.  Brief History, Exam, Impression, and Recommendations:    Primary osteoarthritis, left elbow Initially suspected left elbow distal biceps tinnitus, MRI ultimately confirmed elbow osteoarthritis after failure of distal biceps sheath injection. Today we injected his left elbow joint, return to see me in a month for this.  Primary osteoarthritis of left knee Did not respond to injection at the last visit, getting him approved for viscosupplementation. Return to start Orthovisc or other Visco supplement when approved.  Numbness and tingling of both legs Gerald Stabs is doing well post cervical ACDF, he is having some leg weakness, numbness and tingling, he had x-rays at [facility] that were for the most part unremarkable, he likely has some degree of lumbar spinal stenosis or DDD, adding formal physical therapy, if failure we will proceed with MRI.    ___________________________________________ Gwen Her. Dianah Field, M.D., ABFM., CAQSM. Primary Care and [place]  Instructor of [place] of [facility] of Medicine

## 2020-11-25 NOTE — Telephone Encounter (Signed)
Please work on [employer], both knees, x-ray confirmed and failed injections.

## 2020-11-25 NOTE — Telephone Encounter (Signed)
[facility] pharmacy requesting med refill for dicyclomine. Written by historical provider.

## 2020-11-27 MED ORDER — DICYCLOMINE HCL 20 MG PO TABS
ORAL_TABLET | ORAL | 3 refills | Status: DC
Start: 2020-11-27 — End: 2021-07-04

## 2020-11-28 ENCOUNTER — Ambulatory Visit: Payer: BC Managed Care – PPO | Admitting: Rehabilitative and Restorative Service Providers"

## 2020-11-29 ENCOUNTER — Encounter: Payer: Self-pay | Admitting: Gastroenterology

## 2020-11-29 ENCOUNTER — Ambulatory Visit (AMBULATORY_SURGERY_CENTER): Payer: BC Managed Care – PPO | Admitting: Gastroenterology

## 2020-11-29 ENCOUNTER — Other Ambulatory Visit: Payer: Self-pay

## 2020-11-29 VITALS — BP 126/73 | HR 65 | Temp 98.2°F | Resp 17 | Ht 70.0 in | Wt 290.0 lb

## 2020-11-29 DIAGNOSIS — K222 Esophageal obstruction: Secondary | ICD-10-CM | POA: Diagnosis not present

## 2020-11-29 DIAGNOSIS — R140 Abdominal distension (gaseous): Secondary | ICD-10-CM

## 2020-11-29 DIAGNOSIS — R1319 Other dysphagia: Secondary | ICD-10-CM

## 2020-11-29 DIAGNOSIS — R142 Eructation: Secondary | ICD-10-CM | POA: Diagnosis present

## 2020-11-29 MED ORDER — SODIUM CHLORIDE 0.9 % IV SOLN: 500.0000 mL | Freq: Once | INTRAVENOUS | Status: DC

## 2020-11-29 NOTE — Op Note (Signed)
[facility] Patient Name: Garrett Watson Procedure Date: 11/29/2020 3:57 PM MRN: [id number] Endoscopist: Gerrit Heck , MD Age: 45 Referring MD:  Date of Birth: [birth year] Gender: Male Account #: [id number] Procedure:                Upper GI endoscopy Indications:              Dysphagia, Abdominal bloating, Eructation Medicines:                Monitored Anesthesia Care Procedure:                Pre-Anesthesia Assessment:                           - Prior to the procedure, a History and Physical                            was performed, and patient medications and                            allergies were reviewed. The patient's tolerance of                            previous anesthesia was also reviewed. The risks                            and benefits of the procedure and the sedation                            options and risks were discussed with the patient.                            All questions were answered, and informed consent                            was obtained. Prior Anticoagulants: The patient has                            taken no previous anticoagulant or antiplatelet                            agents. ASA Grade Assessment: II - A patient with                            mild systemic disease. After reviewing the risks                            and benefits, the patient was deemed in                            satisfactory condition to undergo the procedure.                           After obtaining informed consent, the endoscope was  passed under direct vision. Throughout the                            procedure, the patient's blood pressure, pulse, and                            oxygen saturations were monitored continuously. The                            Endoscope was introduced through the mouth, and                            advanced to the second part of duodenum. The upper                            GI  endoscopy was accomplished without difficulty.                            The patient tolerated the procedure well. Scope In: Scope Out: Findings:                 A non-obstructing Schatzki ring was found in the                            lower third of the esophagus. The scope was                            withdrawn. Dilation was performed with a Maloney                            dilator with no resistance at 36 Fr. The dilation                            site was examined following endoscope reinsertion                            and showed no bleeding, mucosal tear or                            perforation. Biopsies were obtained from the                            proximal and distal esophagus with cold forceps for                            histology of suspected eosinophilic esophagitis.                            The ring was then fractured with cold forceps.                            Estimated blood loss was minimal.  The Z-line was regular and was found 43 cm from the                            incisors. Hill Grade 1 valve noted on retroflexion.                           The entire examined stomach was normal. Biopsies                            were taken with a cold forceps for Helicobacter                            pylori testing. Estimated blood loss was minimal.                           The examined duodenum was normal. Biopsies for                            histology were taken with a cold forceps for                            evaluation of celiac disease. Estimated blood loss                            was minimal. Complications:            No immediate complications. Estimated Blood Loss:     Estimated blood loss was minimal. Impression:               - Non-obstructing Schatzki ring. Dilated. Biopsied.                           - Z-line regular, 43 cm from the incisors.                           - Normal stomach. Biopsied.                            - Normal examined duodenum. Biopsied. Recommendation:           - Patient has a contact number available for                            emergencies. The signs and symptoms of potential                            delayed complications were discussed with the                            patient. Return to normal activities tomorrow.                            Written discharge instructions were provided to the  patient.                           - Resume previous diet.                           - Continue present medications.                           - Await pathology results.                           - Return to GI clinic PRN. Gerrit Heck, MD 11/29/2020 4:30:26 PM

## 2020-11-29 NOTE — Progress Notes (Signed)
Called to room to assist during endoscopic procedure.  Patient ID and intended procedure confirmed with present staff. Received instructions for my participation in the procedure from the performing physician.  

## 2020-11-29 NOTE — Progress Notes (Signed)
Pt's states no medical or surgical changes since previsit or office visit. 

## 2020-11-29 NOTE — Patient Instructions (Signed)
Information on esophagitis given to you today.  Await pathology results.  Resume previous diet and medications.  YOU HAD AN ENDOSCOPIC PROCEDURE TODAY AT [facility]:   Refer to the procedure report that was given to you for any specific questions about what was found during the examination.  If the procedure report does not answer your questions, please call your gastroenterologist to clarify.  If you requested that your care partner not be given the details of your procedure findings, then the procedure report has been included in a sealed envelope for you to review at your convenience later.  YOU SHOULD EXPECT: Some feelings of bloating in the abdomen. Passage of more gas than usual.  Walking can help get rid of the air that was put into your GI tract during the procedure and reduce the bloating. If you had a lower endoscopy (such as a colonoscopy or flexible sigmoidoscopy) you may notice spotting of blood in your stool or on the toilet paper. If you underwent a bowel prep for your procedure, you may not have a normal bowel movement for a few days.  Please Note:  You might notice some irritation and congestion in your nose or some drainage.  This is from the oxygen used during your procedure.  There is no need for concern and it should clear up in a day or so.  SYMPTOMS TO REPORT IMMEDIATELY:  Following upper endoscopy (EGD)  Vomiting of blood or coffee ground material  New chest pain or pain under the shoulder blades  Painful or persistently difficult swallowing  New shortness of breath  Fever of 100F or higher  Black, tarry-looking stools  For urgent or emergent issues, a gastroenterologist can be reached at any hour by calling [phone]. Do not use MyChart messaging for urgent concerns.    DIET:  We do recommend a small meal at first, but then you may proceed to your regular diet.  Drink plenty of fluids but you should avoid alcoholic beverages for 24  hours.  ACTIVITY:  You should plan to take it easy for the rest of today and you should NOT DRIVE or use heavy machinery until tomorrow (because of the sedation medicines used during the test).    FOLLOW UP: Our staff will call the number listed on your records 48-72 hours following your procedure to check on you and address any questions or concerns that you may have regarding the information given to you following your procedure. If we do not reach you, we will leave a message.  We will attempt to reach you two times.  During this call, we will ask if you have developed any symptoms of COVID 19. If you develop any symptoms (ie: fever, flu-like symptoms, shortness of breath, cough etc.) before then, please call [phone].  If you test positive for Covid 19 in the 2 weeks post procedure, please call and report this information to [place].    If any biopsies were taken you will be contacted by phone or by letter within the next 1-3 weeks.  Please call us at [phone] if you have not heard about the biopsies in 3 weeks.    SIGNATURES/CONFIDENTIALITY: You and/or your care partner have signed paperwork which will be entered into your electronic medical record.  These signatures attest to the fact that that the information above on your After Visit Summary has been reviewed and is understood.  Full responsibility of the confidentiality of this discharge information lies with you  and/or your care-partner.

## 2020-11-29 NOTE — Progress Notes (Signed)
To pacu, VSS. Report to [email or web address] 

## 2020-12-03 ENCOUNTER — Telehealth: Payer: Self-pay | Admitting: *Deleted

## 2020-12-03 NOTE — Telephone Encounter (Signed)
  Follow up Call-  Call back number 11/29/2020  Post procedure Call Back phone  # [phone]  Permission to leave phone message Yes  Some recent data might be hidden     Patient questions:  Do you have a fever, pain , or abdominal swelling? No. Pain Score  0 *  Have you tolerated food without any problems? Yes.    Have you been able to return to your normal activities? Yes.    Do you have any questions about your discharge instructions: Diet   No. Medications  No. Follow up visit  No.  Do you have questions or concerns about your Care? No.  Actions: * If pain score is 4 or above: No action needed, pain <4.Have you developed a fever since your procedure? no  2.   Have you had an respiratory symptoms (SOB or cough) since your procedure? no  3.   Have you tested positive for COVID 19 since your procedure no  4.   Have you had any family members/close contacts diagnosed with the COVID 19 since your procedure?  no   If yes to any of these questions please route to Joylene John, RN and Joella Prince, RN

## 2020-12-04 ENCOUNTER — Encounter: Payer: Self-pay | Admitting: Physical Therapy

## 2020-12-04 ENCOUNTER — Other Ambulatory Visit: Payer: Self-pay

## 2020-12-04 ENCOUNTER — Ambulatory Visit (INDEPENDENT_AMBULATORY_CARE_PROVIDER_SITE_OTHER): Payer: BC Managed Care – PPO | Admitting: Physical Therapy

## 2020-12-04 DIAGNOSIS — R29898 Other symptoms and signs involving the musculoskeletal system: Secondary | ICD-10-CM

## 2020-12-04 DIAGNOSIS — M6281 Muscle weakness (generalized): Secondary | ICD-10-CM | POA: Diagnosis not present

## 2020-12-04 DIAGNOSIS — R2689 Other abnormalities of gait and mobility: Secondary | ICD-10-CM | POA: Diagnosis not present

## 2020-12-04 NOTE — Therapy (Signed)
[place], [place], [address] Phone: [phone]   Fax:  [phone]  Physical Therapy Evaluation  Patient Details  Name: Garrett Watson MRN: [id number] Date of Birth: 06/03/1976 Referring Provider (PT): thekkekandam   Encounter Date: 12/04/2020   PT End of Session - 12/04/20 0843     Visit Number 1    Number of Visits 6    Date for PT Re-Evaluation 01/15/21    PT Start Time 0800    PT Stop Time 0840    PT Time Calculation (min) 40 min    Activity Tolerance Patient tolerated treatment well    Behavior During Therapy [facility] for tasks assessed/performed             Past Medical History:  Diagnosis Date   Allergy    Anxiety    Arthritis    osteoarthritis - left elbow   Cervical stenosis of spine 2021   Erectile dysfunction    Former cigarette smoker    Hypogonadism male    Hypokalemia    Neuromuscular disorder ([place])    nerapathy   Obesity    Tendonitis    Thyroid disease     Past Surgical History:  Procedure Laterality Date   CERVICAL FUSION     C5- C6 Feb 8. 2022   KNEE ARTHROSCOPY Right    2002, 2018   OSTEOTOMY MAXILLARY     WISDOM TOOTH EXTRACTION     x4    There were no vitals filed for this visit.    Subjective Assessment - 12/04/20 0800     Subjective Pt has cervical fusion 07/2020 which went well and pt was able to return to the gym. Pt has been "pushing himself" more with leg exercises at the gym and his legs have felt "heavy". Pt states his balance is worsening and he has been wearing knee sleeves to reduce pressure and improve balance. 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    Pertinent History cervical fusion 07/2020    Limitations Walking    Diagnostic tests x rays unremarkable    Patient Stated Goals decrease symptoms and be able to work out    Currently in Pain? No/denies                 [facility] PT Assessment - 12/04/20 0001       Assessment   Medical Diagnosis numbness and tingling of both legs    Referring Provider (PT) thekkekandam      Balance Screen   Has the patient fallen in the past 6 months No      Prior Function   Level of Independence Independent      Sensation   Light Touch Appears Intact    Proprioception Appears Intact      Coordination   Gross Motor Movements are Fluid and Coordinated Yes    Heel Shin Test [facility] bilat      Functional Tests   Functional tests Squat;Single leg stance;Single Leg Squat      Squat   Comments knees neutral, Rt foot in supination      Single Leg Squat   Comments needs 1 UE support      Single Leg Stance   Comments 5 seconds bilat      Posture/Postural Control   Posture Comments Rt foot supinated when standing      ROM / Strength   AROM / PROM /  Strength Strength      Strength   Overall Strength Comments VMO 4-/5 bilat    Strength Assessment Site Hip    Right/Left Hip Right;Left    Right Hip Flexion 4/5    Right Hip ABduction 4/5    Left Hip Flexion 4/5    Left Hip ABduction 4/5      Palpation   Palpation comment Rt ankle AROM and strength WFL, Rt ankle jt mobility WFL                        Objective measurements completed on examination: See above findings.       [place] Adult PT Treatment/Exercise - 12/04/20 0001       Exercises   Exercises Knee/Hip      Knee/Hip Exercises: Standing   SLS 30 seconds bilat, then on trampoline x 30 sec bilat with intermittent UE support    SLS with Vectors SLS with forward bend x 2 bilat    Other Standing Knee Exercises side step, monster walk with red TB      Knee/Hip Exercises: Supine   Straight Leg Raises 10 reps    Straight Leg Raise with External Rotation 10 reps                    PT Education - 12/04/20 0842     Education Details PT POC and goals, HEP    Person(s) Educated Patient    Methods  Explanation;Demonstration;Handout    Comprehension Returned demonstration;Verbalized understanding                 PT Long Term Goals - 12/04/20 0852       PT LONG TERM GOAL #1   Title Pt will be independent with HEP    Time 6    Period Weeks    Status New    Target Date 01/15/21      PT LONG TERM GOAL #2   Title Pt will improve bilat LE strength to 4+/5 to return to working out at gym with decreased symptoms    Time 6    Period Weeks    Status New    Target Date 01/15/21      PT LONG TERM GOAL #3   Title Pt will improve SLS on bilat LEs to >= 30 seconds to demo improved balance    Time 6    Period Weeks    Status New    Target Date 01/15/21      PT LONG TERM GOAL #4   Title Pt will return to gym routine with symptoms < 25% of the time    Time 6    Period Weeks    Status New    Target Date 01/15/21                    Plan - 12/04/20 0843     Clinical Impression Statement Pt is a 45 y/o male who presents with decreased strength, balance, impaired posture and decreased activity tolerance and will benefit from skilled PT to address deficits and improve functional strength and mobility    Personal Factors and Comorbidities Past/Current Experience;Fitness;Comorbidity 2    Examination-Participation Restrictions Occupation;Community Activity;Yard Work    [employer] officer Evolving/Moderate complexity    Clinical Decision Making Moderate    Rehab Potential Good    PT Frequency 1x / week    PT Duration 6 weeks    PT Treatment/Interventions Aquatic  Therapy;Cryotherapy;Iontophoresis 4mg /ml Dexamethasone;Moist Heat;Electrical Stimulation;Gait training;Stair training;Neuromuscular re-education;Balance training;Therapeutic exercise;Therapeutic activities;Patient/family education;Orthotic Fit/Training;Manual techniques;Dry needling;Taping;Vasopneumatic Device    PT Next Visit Plan assess HEP, progress coordination and balance    PT Home Exercise  Plan [email or web address]    Consulted and Agree with Plan of Care Patient             Patient will benefit from skilled therapeutic intervention in order to improve the following deficits and impairments:  Postural dysfunction, Decreased strength, Decreased activity tolerance, Decreased endurance, Decreased balance  Visit Diagnosis: Muscle weakness (generalized) - Plan: PT plan of care cert/re-cert  Balance disorder - Plan: PT plan of care cert/re-cert  Other symptoms and signs involving the musculoskeletal system - Plan: PT plan of care cert/re-cert     Problem List Patient Active Problem List   Diagnosis Date Noted   Numbness and tingling of both legs 11/25/2020   History of C5-C6 ACDF February 2022 10/14/2020   Primary osteoarthritis of left knee 06/11/2020   Right insertional Achilles tendinitis 04/30/2020   Right hand pain 04/30/2020   Lumbar spinal stenosis 04/30/2020   Irritable bowel syndrome with diarrhea 12/07/2019   Depression, major, single episode, mild (HCC) 12/07/2019   Mild intermittent asthma without complication 04/07/5101   Recurrent productive cough 08/31/2018   Abnormal weight gain 07/05/2018   Non-seasonal allergic rhinitis 07/05/2018   Class 3 severe obesity due to excess calories with serious comorbidity and body mass index (BMI) of 45.0 to 49.9 in adult [facility]) 07/05/2018   Hypercalcemia 10/03/2017   Forearm tendonitis 10/03/2017   Secondary male hypogonadism 10/03/2017   Hypothyroidism 10/01/2017   Idiopathic peripheral neuropathy 10/01/2017   Primary osteoarthritis, left elbow 10/01/2017   Rosmary Dionisio, PT  Darren Nodal 12/04/2020, 8:56 AM  [facility] [place], [place], [address] Phone: [phone]   Fax:  [phone]  Name: Garrett Watson MRN: [id number] Date of Birth: 1976/05/06

## 2020-12-04 NOTE — Patient Instructions (Signed)
Access Code: [email or web address] URL: [email or web address] Date: 12/04/2020 Prepared by: Isabelle Course  Exercises Side Stepping with Resistance at Thighs - 1 x daily - 7 x weekly - 3 sets - 10 reps Forward Monster Walks - 1 x daily - 7 x weekly - 3 sets - 10 reps Backward Monster Walks - 1 x daily - 7 x weekly - 3 sets - 10 reps Active Straight Leg Raise with Quad Set - 1 x daily - 7 x weekly - 3 sets - 10 reps Straight Leg Raise with External Rotation - 1 x daily - 7 x weekly - 3 sets - 10 reps Single Leg Stance - 1 x daily - 7 x weekly - 3 sets - 1 reps - 30 seconds hold Forward T - 1 x daily - 7 x weekly - 1 sets - 10 reps Single Leg Stance on Foam Pad - 1 x daily - 7 x weekly - 3 sets - 1 reps - 30 seconds hold

## 2020-12-05 ENCOUNTER — Encounter: Payer: Self-pay | Admitting: Gastroenterology

## 2020-12-11 ENCOUNTER — Ambulatory Visit (INDEPENDENT_AMBULATORY_CARE_PROVIDER_SITE_OTHER): Payer: BC Managed Care – PPO | Admitting: Physical Therapy

## 2020-12-11 ENCOUNTER — Other Ambulatory Visit: Payer: Self-pay

## 2020-12-11 DIAGNOSIS — M6281 Muscle weakness (generalized): Secondary | ICD-10-CM

## 2020-12-11 DIAGNOSIS — M25571 Pain in right ankle and joints of right foot: Secondary | ICD-10-CM

## 2020-12-11 DIAGNOSIS — R2689 Other abnormalities of gait and mobility: Secondary | ICD-10-CM | POA: Diagnosis not present

## 2020-12-11 DIAGNOSIS — R29898 Other symptoms and signs involving the musculoskeletal system: Secondary | ICD-10-CM

## 2020-12-11 NOTE — Therapy (Signed)
[place], [place], [address] Phone: [phone]   Fax:  [phone]  Physical Therapy Treatment  Patient Details  Name: Garrett Watson MRN: [id number] Date of Birth: [birth year] Referring Provider (PT): thekkekandam   Encounter Date: 12/11/2020   PT End of Session - 12/11/20 1003     Visit Number 2    Number of Visits 6    Date for PT Re-Evaluation 01/15/21    PT Start Time 0928    PT Stop Time 1004    PT Time Calculation (min) 36 min    Activity Tolerance Patient tolerated treatment well    Behavior During Therapy [facility] for tasks assessed/performed             Past Medical History:  Diagnosis Date   Allergy    Anxiety    Arthritis    osteoarthritis - left elbow   Cervical stenosis of spine 2021   Erectile dysfunction    Former cigarette smoker    Hypogonadism male    Hypokalemia    Neuromuscular disorder ([place])    nerapathy   Obesity    Tendonitis    Thyroid disease     Past Surgical History:  Procedure Laterality Date   CERVICAL FUSION     C5- C6 Feb 8. 2022   KNEE ARTHROSCOPY Right    2002, 2018   OSTEOTOMY MAXILLARY     WISDOM TOOTH EXTRACTION     x4    There were no vitals filed for this visit.   Subjective Assessment - 12/11/20 0933     Subjective Pt states the HEP has been going well and that he feels it is a "work out". Pt states he did legs yesterday at the gym and feels "sore" but not pain    Patient Stated Goals decrease symptoms and be able to work out    Currently in Pain? No/denies                [facility] PT Assessment - 12/11/20 0001       Assessment   Medical Diagnosis numbness and tingling of both legs    Referring Provider (PT) thekkekandam      Single Leg Stance   Comments Rt LE 14 sec, Lt LE 30 sec                           [facility] Adult PT Treatment/Exercise - 12/11/20 0001       Knee/Hip Exercises: Standing    SLS with Vectors SLS with forward bend x 10 with cues for form, SLS with vectors 3 ways x 10 bilat    Rebounder SLS with 2kg ball toss 2 x 10 bilat    Other Standing Knee Exercises tap ups 6'' step 3 x 30 sec with focus on speed    Other Standing Knee Exercises tandem stance on foam 2 x 30 sec bilat, sidestep with red TB around midfoot 3 x 10 bilat      Knee/Hip Exercises: Supine   Straight Leg Raises 20 reps    Straight Leg Raise with External Rotation 20 reps                         PT Long Term Goals - 12/04/20 9381       PT LONG TERM GOAL #1   Title Pt will be independent with HEP    Time  6    Period Weeks    Status New    Target Date 01/15/21      PT LONG TERM GOAL #2   Title Pt will improve bilat LE strength to 4+/5 to return to working out at gym with decreased symptoms    Time 6    Period Weeks    Status New    Target Date 01/15/21      PT LONG TERM GOAL #3   Title Pt will improve SLS on bilat LEs to >= 30 seconds to demo improved balance    Time 6    Period Weeks    Status New    Target Date 01/15/21      PT LONG TERM GOAL #4   Title Pt will return to gym routine with symptoms < 25% of the time    Time 6    Period Weeks    Status New    Target Date 01/15/21                   Plan - 12/11/20 1004     Clinical Impression Statement Pt continues with most difficulty with single leg stance and balance on compliant surfaces. Pt with good tolerance to progression of balance and coordination exercises with no increase in pain    PT Next Visit Plan progress HEP, balance and coordination    PT Home Exercise Plan [email or web address]    Consulted and Agree with Plan of Care Patient             Patient will benefit from skilled therapeutic intervention in order to improve the following deficits and impairments:     Visit Diagnosis: Muscle weakness (generalized)  Balance disorder  Other symptoms and signs involving the musculoskeletal  system  Pain in right ankle and joints of right foot     Problem List Patient Active Problem List   Diagnosis Date Noted   Numbness and tingling of both legs 11/25/2020   History of C5-C6 ACDF February 2022 10/14/2020   Primary osteoarthritis of left knee 06/11/2020   Right insertional Achilles tendinitis 04/30/2020   Right hand pain 04/30/2020   Lumbar spinal stenosis 04/30/2020   Irritable bowel syndrome with diarrhea 12/07/2019   Depression, major, single episode, mild (HCC) 12/07/2019   Mild intermittent asthma without complication 09/60/4540   Recurrent productive cough 08/31/2018   Abnormal weight gain 07/05/2018   Non-seasonal allergic rhinitis 07/05/2018   Class 3 severe obesity due to excess calories with serious comorbidity and body mass index (BMI) of 45.0 to 49.9 in adult [facility]) 07/05/2018   Hypercalcemia 10/03/2017   Forearm tendonitis 10/03/2017   Secondary male hypogonadism 10/03/2017   Hypothyroidism 10/01/2017   Idiopathic peripheral neuropathy 10/01/2017   Primary osteoarthritis, left elbow 10/01/2017   Uzziah Rigg, PT  Aleane Wesenberg 12/11/2020, 10:06 AM  [facility] [place], [place], [address] Phone: [phone]   Fax:  [phone]  Name: Garrett Watson MRN: [id number] Date of Birth: [birth year]

## 2020-12-18 ENCOUNTER — Other Ambulatory Visit: Payer: Self-pay

## 2020-12-18 ENCOUNTER — Ambulatory Visit (INDEPENDENT_AMBULATORY_CARE_PROVIDER_SITE_OTHER): Payer: BC Managed Care – PPO | Admitting: Physical Therapy

## 2020-12-18 DIAGNOSIS — R2689 Other abnormalities of gait and mobility: Secondary | ICD-10-CM | POA: Diagnosis not present

## 2020-12-18 DIAGNOSIS — M25571 Pain in right ankle and joints of right foot: Secondary | ICD-10-CM | POA: Diagnosis not present

## 2020-12-18 DIAGNOSIS — R29898 Other symptoms and signs involving the musculoskeletal system: Secondary | ICD-10-CM | POA: Diagnosis not present

## 2020-12-18 DIAGNOSIS — M6281 Muscle weakness (generalized): Secondary | ICD-10-CM | POA: Diagnosis not present

## 2020-12-18 NOTE — Patient Instructions (Signed)
Access Code: [email or web address] URL: [email or web address] Date: 12/18/2020 Prepared by: Isabelle Course  Exercises Side Stepping with Resistance at Thighs - 1 x daily - 7 x weekly - 3 sets - 10 reps Forward Monster Walks - 1 x daily - 7 x weekly - 3 sets - 10 reps Backward Monster Walks - 1 x daily - 7 x weekly - 3 sets - 10 reps Active Straight Leg Raise with Quad Set - 1 x daily - 7 x weekly - 3 sets - 10 reps Straight Leg Raise with External Rotation - 1 x daily - 7 x weekly - 3 sets - 10 reps Single Leg Stance - 1 x daily - 7 x weekly - 3 sets - 1 reps - 30 seconds hold Forward T - 1 x daily - 7 x weekly - 1 sets - 10 reps Single Leg Stance on Foam Pad - 1 x daily - 7 x weekly - 3 sets - 1 reps - 30 seconds hold Single Leg Bridge - 1 x daily - 7 x weekly - 3 sets - 10 reps

## 2020-12-18 NOTE — Therapy (Signed)
[place], [place], [address] Phone: [phone]   Fax:  [phone]  Physical Therapy Treatment  Patient Details  Name: Garrett Watson MRN: [id number] Date of Birth: 1975/08/02 Referring Provider (PT): thekkekandam   Encounter Date: 12/18/2020   PT End of Session - 12/18/20 0837     Visit Number 3    Number of Visits 6    Date for PT Re-Evaluation 01/15/21    PT Start Time 0800    PT Stop Time 0838    PT Time Calculation (min) 38 min    Activity Tolerance Patient tolerated treatment well    Behavior During Therapy [facility] for tasks assessed/performed             Past Medical History:  Diagnosis Date   Allergy    Anxiety    Arthritis    osteoarthritis - left elbow   Cervical stenosis of spine 2021   Erectile dysfunction    Former cigarette smoker    Hypogonadism male    Hypokalemia    Neuromuscular disorder ([place])    nerapathy   Obesity    Tendonitis    Thyroid disease     Past Surgical History:  Procedure Laterality Date   CERVICAL FUSION     C5- C6 Feb 8. 2022   KNEE ARTHROSCOPY Right    2002, 2018   OSTEOTOMY MAXILLARY     WISDOM TOOTH EXTRACTION     x4    There were no vitals filed for this visit.   Subjective Assessment - 12/18/20 0802     Subjective Pt states he had some pain down his Lt shin but that his knee overall feels better    Patient Stated Goals decrease symptoms and be able to work out    Currently in Pain? No/denies                [facility] PT Assessment - 12/18/20 0001       Assessment   Medical Diagnosis numbness and tingling of both legs    Referring Provider (PT) thekkekandam      Single Leg Stance   Comments 30 seconds bilat      Strength   Overall Strength Comments VMO 4/5 bilat    Right Hip Flexion 4+/5    Right Hip ABduction 4+/5    Left Hip Flexion 4+/5    Left Hip ABduction 4+/5                            [facility] Adult PT Treatment/Exercise - 12/18/20 0001       High Level Balance   High Level Balance Comments standing on rocker board balancing x 1 min laterally and A/P, rocker board with trunk diagonals with 8# x 10 bilat      Knee/Hip Exercises: Aerobic   Elliptical 3 min level 3      Knee/Hip Exercises: Standing   SLS with Vectors SLS on foam with vectors x 10 bilat    Rebounder SLS 2kg ball toss x 10 bilat, then SLS on foam with ball toss    Other Standing Knee Exercises side step, monster walk with green TB, side step with red TB around feet      Knee/Hip Exercises: Supine   Single Leg Watson Both;10 reps                    PT Education - 12/18/20  0837     Education Details Modifications to gym routine to improve balance and decrease knee pain    Person(s) Educated Patient    Methods Explanation    Comprehension Verbalized understanding                 PT Long Term Goals - 12/18/20 0826       PT LONG TERM GOAL #1   Title Pt will be independent with HEP    Status On-going    Target Date 01/15/21      PT LONG TERM GOAL #2   Title Pt will improve bilat LE strength to 4+/5 to return to working out at gym with decreased symptoms    Status Achieved      PT LONG TERM GOAL #3   Title Pt will improve SLS on bilat LEs to >= 30 seconds to demo improved balance    Status Achieved      PT LONG TERM GOAL #4   Title Pt will return to gym routine with symptoms < 25% of the time    Status On-going    Target Date 01/15/21                   Plan - 12/18/20 3291     Clinical Impression Statement Pt with improving balance and LE strength and has met 2/4 goals. Continues with deficits in balance and coordination and will continue to benefit from skilled PT to address deficits    PT Next Visit Plan balance and coordination    PT Home Exercise Plan [email or web address]    Consulted and Agree with Plan of Care Patient              Patient will benefit from skilled therapeutic intervention in order to improve the following deficits and impairments:     Visit Diagnosis: Muscle weakness (generalized)  Balance disorder  Other symptoms and signs involving the musculoskeletal system  Pain in right ankle and joints of right foot     Problem List Patient Active Problem List   Diagnosis Date Noted   Numbness and tingling of both legs 11/25/2020   History of C5-C6 ACDF February 2022 10/14/2020   Primary osteoarthritis of left knee 06/11/2020   Right insertional Achilles tendinitis 04/30/2020   Right hand pain 04/30/2020   Lumbar spinal stenosis 04/30/2020   Irritable bowel syndrome with diarrhea 12/07/2019   Depression, major, single episode, mild (HCC) 12/07/2019   Mild intermittent asthma without complication 91/66/0600   Recurrent productive cough 08/31/2018   Abnormal weight gain 07/05/2018   Non-seasonal allergic rhinitis 07/05/2018   Class 3 severe obesity due to excess calories with serious comorbidity and body mass index (BMI) of 45.0 to 49.9 in adult [facility]) 07/05/2018   Hypercalcemia 10/03/2017   Forearm tendonitis 10/03/2017   Secondary male hypogonadism 10/03/2017   Hypothyroidism 10/01/2017   Idiopathic peripheral neuropathy 10/01/2017   Primary osteoarthritis, left elbow 10/01/2017   Arleigh Odowd, PT  Kimerly Rowand 12/18/2020, 8:40 AM  [facility] [place], [place], [address] Phone: [phone]   Fax:  [phone]  Name: Garrett Watson MRN: [id number] Date of Birth: 14-Apr-1976

## 2020-12-24 ENCOUNTER — Ambulatory Visit: Payer: BC Managed Care – PPO | Admitting: Sports Medicine

## 2020-12-25 ENCOUNTER — Ambulatory Visit (INDEPENDENT_AMBULATORY_CARE_PROVIDER_SITE_OTHER): Payer: BC Managed Care – PPO | Admitting: Sports Medicine

## 2020-12-25 ENCOUNTER — Other Ambulatory Visit: Payer: Self-pay

## 2020-12-25 ENCOUNTER — Encounter: Payer: Self-pay | Admitting: Sports Medicine

## 2020-12-25 DIAGNOSIS — M1712 Unilateral primary osteoarthritis, left knee: Secondary | ICD-10-CM

## 2020-12-25 DIAGNOSIS — M2170 Unequal limb length (acquired), unspecified site: Secondary | ICD-10-CM | POA: Insufficient documentation

## 2020-12-25 DIAGNOSIS — M19022 Primary osteoarthritis, left elbow: Secondary | ICD-10-CM | POA: Diagnosis not present

## 2020-12-25 NOTE — Progress Notes (Signed)
    Procedures performed today:    None.  Independent interpretation of notes and tests performed by another provider:   None.  Brief History, Exam, Impression, and Recommendations:    Primary osteoarthritis of left knee Gerald Stabs returns, he continues to have pain in his left knee, medial joint line, mechanical symptoms, positive McMurray's sign. 2 months ago we did injection that did not work, I do not have any approval for viscosupplementation just yet but considering mechanical symptoms and medial joint line pain I think we should do an MRI to ensure were not missing a meniscal tear that would need to be treated arthroscopically.  Primary osteoarthritis, left elbow After initial suspicion for a left elbow distal biceps tendinitis MRI confirmed elbow osteoarthritis after failure of distal biceps sheath injection, at the last visit I injected his left elbow joint and he returns today mostly pain-free.  Leg length discrepancy Gerald Stabs also has a leg length discrepancy, foot pain, his left leg is about 2 cm longer than the right, sending him to Dr. Raeford Razor for custom orthotics with a lift on the right.    ___________________________________________ Gwen Her. Dianah Field, M.D., ABFM., CAQSM. Primary Care and [place] Instructor of [place] [employer] [facility] of Medicine

## 2020-12-25 NOTE — Telephone Encounter (Signed)
Any updates on this one??

## 2020-12-25 NOTE — Assessment & Plan Note (Signed)
Gerald Stabs also has a leg length discrepancy, foot pain, his left leg is about 2 cm longer than the right, sending him to Dr. Raeford Razor for custom orthotics with a lift on the right.

## 2020-12-25 NOTE — Assessment & Plan Note (Signed)
After initial suspicion for a left elbow distal biceps tendinitis MRI confirmed elbow osteoarthritis after failure of distal biceps sheath injection, at the last visit I injected his left elbow joint and he returns today mostly pain-free.

## 2020-12-25 NOTE — Assessment & Plan Note (Signed)
Garrett Watson returns, he continues to have pain in his left knee, medial joint line, mechanical symptoms, positive McMurray's sign. 2 months ago we did injection that did not work, I do not have any approval for viscosupplementation just yet but considering mechanical symptoms and medial joint line pain I think we should do an MRI to ensure were not missing a meniscal tear that would need to be treated arthroscopically.

## 2020-12-26 ENCOUNTER — Encounter: Payer: BC Managed Care – PPO | Admitting: Physical Therapy

## 2021-01-01 NOTE — Telephone Encounter (Signed)
Submitted through [facility]. Waiting for BID and prior authorization.

## 2021-01-02 ENCOUNTER — Encounter: Payer: Self-pay | Admitting: Physical Therapy

## 2021-01-02 ENCOUNTER — Other Ambulatory Visit: Payer: Self-pay

## 2021-01-02 ENCOUNTER — Ambulatory Visit (INDEPENDENT_AMBULATORY_CARE_PROVIDER_SITE_OTHER): Payer: BC Managed Care – PPO | Admitting: Physical Therapy

## 2021-01-02 DIAGNOSIS — R29898 Other symptoms and signs involving the musculoskeletal system: Secondary | ICD-10-CM

## 2021-01-02 DIAGNOSIS — M6281 Muscle weakness (generalized): Secondary | ICD-10-CM

## 2021-01-02 DIAGNOSIS — R2689 Other abnormalities of gait and mobility: Secondary | ICD-10-CM

## 2021-01-02 NOTE — Therapy (Signed)
[place], [place], [address] Phone: [phone]   Fax:  [phone]  Physical Therapy Treatment  Patient Details  Name: Garrett Watson MRN: [id number] Date of Birth: 10/31/1975 Referring Provider (PT): thekkekandam   Encounter Date: 01/02/2021   PT End of Session - 01/02/21 1525     Visit Number 4    Number of Visits 6    Date for PT Re-Evaluation 01/15/21    PT Start Time 6962    PT Stop Time 9528    PT Time Calculation (min) 45 min    Activity Tolerance Patient tolerated treatment well    Behavior During Therapy [facility] for tasks assessed/performed             Past Medical History:  Diagnosis Date   Allergy    Anxiety    Arthritis    osteoarthritis - left elbow   Cervical stenosis of spine 2021   Erectile dysfunction    Former cigarette smoker    Hypogonadism male    Hypokalemia    Neuromuscular disorder ([place])    nerapathy   Obesity    Tendonitis    Thyroid disease     Past Surgical History:  Procedure Laterality Date   CERVICAL FUSION     C5- C6 Feb 8. 2022   KNEE ARTHROSCOPY Right    2002, 2018   OSTEOTOMY MAXILLARY     WISDOM TOOTH EXTRACTION     x4    There were no vitals filed for this visit.   Subjective Assessment - 01/02/21 1526     Subjective Pt reports that his Lt lateral knee pain returned yesterday; not sure what he did.  He is doing exercises 3x/day.  He feels like he needs stronger bands.    Patient Stated Goals decrease symptoms and be able to work out    Currently in Pain? Yes    Pain Score 1     Pain Location Leg   knee to ankle   Pain Orientation Left    Pain Descriptors / Indicators Aching;Burning    Aggravating Factors  ?    Pain Relieving Factors ?                [facility] PT Assessment - 01/02/21 0001       Assessment   Medical Diagnosis numbness and tingling of both legs    Referring Provider (PT) thekkekandam                 [facility] Adult PT Treatment/Exercise - 01/02/21 0001       Self-Care   Self-Care Other Self-Care Comments    Other Self-Care Comments  pt instructed in self massage with roller stick, and ball. Pt returned demo with cues.      Knee/Hip Exercises: Stretches   ITB Stretch Right;Left;2 reps;20 seconds   standing and supine   Piriformis Stretch Right;Left;3 reps      Knee/Hip Exercises: Aerobic   Elliptical L3: 2.5 min for warm up      Knee/Hip Exercises: Standing   SLS with Vectors SLS forward leans to touch chair, with slow speed and nou touching down between reps x 5 each leg.    Other Standing Knee Exercises side step (blue band at ankles) and forward/backward monster walk with blue TB at thighs.,      Knee/Hip Exercises: Supine   Single Leg Bridge Strengthening;Right;Left;1 set;5 reps   with blue band at knees.  Knee/Hip Exercises: Sidelying   Clams blue band at knees x 10                         PT Long Term Goals - 12/18/20 0826       PT LONG TERM GOAL #1   Title Pt will be independent with HEP    Status On-going    Target Date 01/15/21      PT LONG TERM GOAL #2   Title Pt will improve bilat LE strength to 4+/5 to return to working out at gym with decreased symptoms    Status Achieved      PT LONG TERM GOAL #3   Title Pt will improve SLS on bilat LEs to >= 30 seconds to demo improved balance    Status Achieved      PT LONG TERM GOAL #4   Title Pt will return to gym routine with symptoms < 25% of the time    Status On-going    Target Date 01/15/21                   Plan - 01/02/21 1526     Clinical Impression Statement Pt arrives with L lower leg pain with no know cause. Pt reported tenderness in Lt distal hamstring and hip rotators with stretches and self massage.  Updated HEP with stretches and self massage to address tightness in these areas.  Advised pt to slow the speed of his exercises to advance them.  Pt issued blue band  for existing exercises.  Pt progressing well towards remaining goals.    PT Frequency 1x / week    PT Duration 6 weeks    PT Treatment/Interventions Aquatic Therapy;Cryotherapy;Iontophoresis 4mg /ml Dexamethasone;Moist Heat;Electrical Stimulation;Gait training;Stair training;Neuromuscular re-education;Balance training;Therapeutic exercise;Therapeutic activities;Patient/family education;Orthotic Fit/Training;Manual techniques;Dry needling;Taping;Vasopneumatic Device    PT Next Visit Plan end of POC.  assess goals and response to stretches and blue band with exercises.    PT Home Exercise Plan [email or web address]    Consulted and Agree with Plan of Care Patient             Patient will benefit from skilled therapeutic intervention in order to improve the following deficits and impairments:  Postural dysfunction, Decreased strength, Decreased activity tolerance, Decreased endurance, Decreased balance  Visit Diagnosis: Muscle weakness (generalized)  Balance disorder  Other symptoms and signs involving the musculoskeletal system     Problem List Patient Active Problem List   Diagnosis Date Noted   Leg length discrepancy 12/25/2020   Numbness and tingling of both legs 11/25/2020   History of C5-C6 ACDF February 2022 10/14/2020   Primary osteoarthritis of left knee 06/11/2020   Right insertional Achilles tendinitis 04/30/2020   Right hand pain 04/30/2020   Lumbar spinal stenosis 04/30/2020   Irritable bowel syndrome with diarrhea 12/07/2019   Depression, major, single episode, mild (HCC) 12/07/2019   Mild intermittent asthma without complication 63/84/5364   Recurrent productive cough 08/31/2018   Abnormal weight gain 07/05/2018   Non-seasonal allergic rhinitis 07/05/2018   Class 3 severe obesity due to excess calories with serious comorbidity and body mass index (BMI) of 45.0 to 49.9 in adult [facility]) 07/05/2018   Hypercalcemia 10/03/2017   Forearm tendonitis 10/03/2017   Secondary male  hypogonadism 10/03/2017   Hypothyroidism 10/01/2017   Idiopathic peripheral neuropathy 10/01/2017   Primary osteoarthritis, left elbow 10/01/2017    Kerin Perna, PTA 01/02/21 6:08 PM   [place] [address]  [place], [place], [address] Phone: [phone]   Fax:  [phone]  Name: KAYDENCE BABA MRN: [id number] Date of Birth: 05/17/1976

## 2021-01-06 ENCOUNTER — Ambulatory Visit (INDEPENDENT_AMBULATORY_CARE_PROVIDER_SITE_OTHER): Payer: BC Managed Care – PPO | Admitting: Family Medicine

## 2021-01-06 ENCOUNTER — Other Ambulatory Visit: Payer: Self-pay

## 2021-01-06 ENCOUNTER — Encounter: Payer: Self-pay | Admitting: Family Medicine

## 2021-01-06 DIAGNOSIS — M2170 Unequal limb length (acquired), unspecified site: Secondary | ICD-10-CM

## 2021-01-06 NOTE — Progress Notes (Signed)
Garrett Watson - 45 y.o. male MRN [id number]  Date of birth: 06-Jun-1976  SUBJECTIVE:  Including CC & ROS.  No chief complaint on file.   Garrett Watson is a 45 y.o. male that is presenting with right knee pain.  He is found to have a shorter right leg..   Review of Systems See HPI   HISTORY: Past Medical, Surgical, Social, and Family History Reviewed & Updated per EMR.   Pertinent Historical Findings include:  Past Medical History:  Diagnosis Date   Allergy    Anxiety    Arthritis    osteoarthritis - left elbow   Cervical stenosis of spine 2021   Erectile dysfunction    Former cigarette smoker    Hypogonadism male    Hypokalemia    Neuromuscular disorder (HCC)    nerapathy   Obesity    Tendonitis    Thyroid disease     Past Surgical History:  Procedure Laterality Date   CERVICAL FUSION     C5- C6 Feb 8. 2022   KNEE ARTHROSCOPY Right    2002, 2018   OSTEOTOMY MAXILLARY     WISDOM TOOTH EXTRACTION     x4    Family History  Problem Relation Age of Onset   Alcohol abuse Father    Hyperlipidemia Father    Hypertension Father    Stroke Father    Alcohol abuse Brother    Stroke Paternal Aunt    Diabetes Paternal Aunt    Diabetes Paternal Uncle    Kidney cancer Maternal Grandmother    Lung cancer Maternal Grandfather    Alcohol abuse Brother    Diabetes Cousin    Depression Cousin    Colon polyps Mother    Colon cancer Neg Hx    Stomach cancer Neg Hx    Esophageal cancer Neg Hx    Rectal cancer Neg Hx     Social History   Socioeconomic History   Marital status: Single    Spouse name: Not on file   Number of children: Not on file   Years of education: Not on file   Highest education level: Not on file  Occupational History   Not on file  Tobacco Use   Smoking status: Former    Types: Cigarettes    Quit date: 08/12/2015    Years since quitting: 5.4   Smokeless tobacco: Never  Vaping Use   Vaping Use: Former  Substance and Sexual  Activity   Alcohol use: Yes    Comment: rarely   Drug use: Not Currently    Types: Marijuana    Comment: Uses CBD   Sexual activity: Not Currently  Other Topics Concern   Not on file  Social History Narrative   Not on file   Social Determinants of Health   Financial Resource Strain: Not on file  Food Insecurity: Not on file  Transportation Needs: Not on file  Physical Activity: Not on file  Stress: Not on file  Social Connections: Not on file  Intimate Partner Violence: Not on file     PHYSICAL EXAM:  VS: Ht 5\' 10"  (1.778 m)   Wt 290 lb (131.5 kg)   BMI 41.61 kg/m  Physical Exam Gen: NAD, alert, cooperative with exam, well-appearing MSK:  Right and left foot: Tends to roll his right foot out while ambulating. Neurovascular intact  Patient was fitted for a standard, cushioned, semi-rigid orthotic. The orthotic was heated and afterward the patient stood on the orthotic blank  positioned on the orthotic stand. The patient was positioned in subtalar neutral position and 10 degrees of ankle dorsiflexion in a weight bearing stance. After completion of molding, a stable base was applied to the orthotic blank. The blank was ground to a stable position for weight bearing. Size: 11 Pairs: 2 Base: Blue EVA Additional Posting and Padding: Fifth ray posting with heel wedge The patient ambulated these, and they were very comfortable.    ASSESSMENT & PLAN:   Leg length discrepancy Acute on chronic in nature.  His right leg is shorter than his left. -Counseled on home exercise therapy and supportive care. -Custom orthotics. -Heel wedge that is cut to have a lateral wedge -Fifth ray posting

## 2021-01-06 NOTE — Assessment & Plan Note (Signed)
Acute on chronic in nature.  His right leg is shorter than his left. -Counseled on home exercise therapy and supportive care. -Custom orthotics. -Heel wedge that is cut to have a lateral wedge -Fifth ray posting

## 2021-01-15 ENCOUNTER — Ambulatory Visit (INDEPENDENT_AMBULATORY_CARE_PROVIDER_SITE_OTHER): Payer: BC Managed Care – PPO | Admitting: Physical Therapy

## 2021-01-15 ENCOUNTER — Other Ambulatory Visit: Payer: Self-pay

## 2021-01-15 DIAGNOSIS — R2689 Other abnormalities of gait and mobility: Secondary | ICD-10-CM | POA: Diagnosis not present

## 2021-01-15 DIAGNOSIS — R29898 Other symptoms and signs involving the musculoskeletal system: Secondary | ICD-10-CM | POA: Diagnosis not present

## 2021-01-15 DIAGNOSIS — M6281 Muscle weakness (generalized): Secondary | ICD-10-CM

## 2021-01-15 NOTE — Therapy (Signed)
[place], [place], [address] Phone: [phone]   Fax:  [phone]  Physical Therapy Treatment and Discharge  Patient Details  Name: Garrett Watson MRN: [id number] Date of Birth: [birth year] Referring Provider (PT): thekkekandam   Encounter Date: 01/15/2021   PT End of Session - 01/15/21 0827     Visit Number 5    Number of Visits 6    Date for PT Re-Evaluation 01/15/21    PT Start Time 0800    PT Stop Time 0823    PT Time Calculation (min) 23 min    Activity Tolerance Patient limited by pain    Behavior During Therapy [facility] for tasks assessed/performed             Past Medical History:  Diagnosis Date   Allergy    Anxiety    Arthritis    osteoarthritis - left elbow   Cervical stenosis of spine 2021   Erectile dysfunction    Former cigarette smoker    Hypogonadism male    Hypokalemia    Neuromuscular disorder ([place])    nerapathy   Obesity    Tendonitis    Thyroid disease     Past Surgical History:  Procedure Laterality Date   CERVICAL FUSION     C5- C6 Feb 8. 2022   KNEE ARTHROSCOPY Right    2002, 2018   OSTEOTOMY MAXILLARY     WISDOM TOOTH EXTRACTION     x4    There were no vitals filed for this visit.   Subjective Assessment - 01/15/21 0803     Subjective Pt states he "tweaked" his Lt knee a few weeks ago at a concert and has been having pain ever since. He has not been able to return to the gym due to Lt knee pain    Patient Stated Goals decrease symptoms and be able to work out    Currently in Pain? Yes    Pain Score 2     Pain Location Knee    Pain Orientation Left    Pain Descriptors / Indicators Aching;Burning                [facility] PT Assessment - 01/15/21 0001       Assessment   Medical Diagnosis numbness and tingling of both legs    Referring Provider (PT) thekkekandam      Single Leg Stance   Comments 30 sec bilat                            [facility] Adult PT Treatment/Exercise - 01/15/21 0001       Knee/Hip Exercises: Stretches   ITB Stretch Right;Left;2 reps;20 seconds   standing and supine   Piriformis Stretch Right;Left;3 reps      Knee/Hip Exercises: Aerobic   Elliptical L3 x 2 min for warm up      Knee/Hip Exercises: Supine   Single Leg Bridge --   unable to tolerate due to Lt knee pain     Knee/Hip Exercises: Sidelying   Hip ABduction Both;20 reps    Clams x 20 bilat                    PT Education - 01/15/21 0821     Education Details plan for d/c and modifications to HEP due to knee pain    Person(s) Educated Patient    Methods Explanation  Comprehension Verbalized understanding                 PT Long Term Goals - 01/15/21 0809       PT LONG TERM GOAL #1   Title Pt will be independent with HEP    Status Achieved      PT LONG TERM GOAL #2   Title Pt will improve bilat LE strength to 4+/5 to return to working out at gym with decreased symptoms    Status Achieved      PT LONG TERM GOAL #3   Title Pt will improve SLS on bilat LEs to >= 30 seconds to demo improved balance    Status Achieved      PT LONG TERM GOAL #4   Title Pt will return to gym routine with symptoms < 25% of the time    Status Not Met      PT LONG TERM GOAL #5   Title The patient will return to gym routine (as able due to other medical issues).    Status Not Met                   Plan - 01/15/21 0827     Clinical Impression Statement Pt limited by onset of Lt knee pain which pt states is "loose cartilage" and pt states he will be having surgery as soon as it is scheduled. Session focused on review of HEP for exercises that do not irritate Lt knee and progression of exercises for when his Lt knee is feeling better. Pt met 3/5 goals and was limited only by new onset of LT knee pain. Pt has improved balance and flexibility as well as activity tolerance. Plan to d/c from PT  at this time    PT Next Visit Plan d/c    PT Home Exercise Plan [facility]    Consulted and Agree with Plan of Care Patient             Patient will benefit from skilled therapeutic intervention in order to improve the following deficits and impairments:     Visit Diagnosis: Muscle weakness (generalized)  Balance disorder  Other symptoms and signs involving the musculoskeletal system     Problem List Patient Active Problem List   Diagnosis Date Noted   Leg length discrepancy 12/25/2020   Numbness and tingling of both legs 11/25/2020   History of C5-C6 ACDF February 2022 10/14/2020   Primary osteoarthritis of left knee 06/11/2020   Right insertional Achilles tendinitis 04/30/2020   Right hand pain 04/30/2020   Lumbar spinal stenosis 04/30/2020   Irritable bowel syndrome with diarrhea 12/07/2019   Depression, major, single episode, mild ([place]) 12/07/2019   Mild intermittent asthma without complication 25/10/3974   Recurrent productive cough 08/31/2018   Abnormal weight gain 07/05/2018   Non-seasonal allergic rhinitis 07/05/2018   Class 3 severe obesity due to excess calories with serious comorbidity and body mass index (BMI) of 45.0 to 49.9 in adult [facility]) 07/05/2018   Hypercalcemia 10/03/2017   Forearm tendonitis 10/03/2017   Secondary male hypogonadism 10/03/2017   Hypothyroidism 10/01/2017   Idiopathic peripheral neuropathy 10/01/2017   Primary osteoarthritis, left elbow 10/01/2017   PHYSICAL THERAPY DISCHARGE SUMMARY  Visits from Start of Care: 5  Current functional level related to goals / functional outcomes: Improved balance, strength and activity tolerance   Remaining deficits: See above   Education / Equipment: HEP   Patient agrees to discharge. Patient goals were partially met. Patient is  being discharged due to being pleased with the current functional level. Isabelle Course, PT  [facility] 01/15/2021, 8:30 AM  [facility] [place], [place], [address] Phone: [phone]   Fax:  [phone]  Name: Garrett Watson MRN: [id number] Date of Birth: 20-Jul-1975

## 2021-01-15 NOTE — Patient Instructions (Signed)
Access Code: [email or web address] URL: [email or web address] Date: 01/15/2021 Prepared by: Isabelle Course  Exercises Side Stepping with Resistance at Thighs - 1 x daily - 7 x weekly - 3 sets - 10 reps Forward Monster Walks - 1 x daily - 7 x weekly - 3 sets - 10 reps Backward Monster Walks - 1 x daily - 7 x weekly - 3 sets - 10 reps Active Straight Leg Raise with Quad Set - 1 x daily - 7 x weekly - 3 sets - 10 reps Straight Leg Raise with External Rotation - 1 x daily - 7 x weekly - 3 sets - 10 reps Single Leg Stance - 1 x daily - 7 x weekly - 3 sets - 1 reps - 30 seconds hold Forward T - 1 x daily - 7 x weekly - 1 sets - 10 reps Single Leg Stance on Foam Pad - 1 x daily - 7 x weekly - 3 sets - 1 reps - 30 seconds hold Single Leg Bridge - 1 x daily - 7 x weekly - 3 sets - 10 reps ITB Stretch at Wall - 1 x daily - 7 x weekly - 1 sets - 2 reps - 20 sec hold Supine ITB Stretch with Strap - 1 x daily - 7 x weekly - 1 sets - 2 reps - 20 seconds hold Seated Table Piriformis Stretch - 1 x daily - 7 x weekly - 1 sets - 2-3 reps - 20-30 seconds hold

## 2021-01-19 ENCOUNTER — Ambulatory Visit (INDEPENDENT_AMBULATORY_CARE_PROVIDER_SITE_OTHER): Payer: BC Managed Care – PPO

## 2021-01-19 ENCOUNTER — Other Ambulatory Visit: Payer: Self-pay

## 2021-01-19 DIAGNOSIS — M1712 Unilateral primary osteoarthritis, left knee: Secondary | ICD-10-CM

## 2021-01-21 DIAGNOSIS — M1712 Unilateral primary osteoarthritis, left knee: Secondary | ICD-10-CM

## 2021-02-25 ENCOUNTER — Encounter: Payer: Self-pay | Admitting: Physical Therapy

## 2021-02-25 ENCOUNTER — Ambulatory Visit (INDEPENDENT_AMBULATORY_CARE_PROVIDER_SITE_OTHER): Payer: BC Managed Care – PPO | Admitting: Physical Therapy

## 2021-02-25 DIAGNOSIS — M25562 Pain in left knee: Secondary | ICD-10-CM | POA: Diagnosis not present

## 2021-02-25 DIAGNOSIS — R29898 Other symptoms and signs involving the musculoskeletal system: Secondary | ICD-10-CM | POA: Diagnosis not present

## 2021-02-25 DIAGNOSIS — R2689 Other abnormalities of gait and mobility: Secondary | ICD-10-CM

## 2021-02-25 DIAGNOSIS — M6281 Muscle weakness (generalized): Secondary | ICD-10-CM | POA: Diagnosis not present

## 2021-02-25 NOTE — Therapy (Signed)
[place], [place], [address] Phone: [phone]   Fax:  [phone]  Physical Therapy Evaluation  Patient Details  Name: Garrett Watson MRN: [id number] Date of Birth: 26-Jul-1975 Referring Provider (PT): Dorna Leitz   Encounter Date: 02/25/2021   PT End of Session - 02/25/21 1002     Visit Number 1    Number of Visits 12    Date for PT Re-Evaluation 04/08/21    PT Start Time 0930    PT Stop Time 1003    PT Time Calculation (min) 33 min    Activity Tolerance Patient tolerated treatment well    Behavior During Therapy [facility] for tasks assessed/performed             Past Medical History:  Diagnosis Date   Allergy    Anxiety    Arthritis    osteoarthritis - left elbow   Cervical stenosis of spine 2021   Erectile dysfunction    Former cigarette smoker    Hypogonadism male    Hypokalemia    Neuromuscular disorder ([place])    nerapathy   Obesity    Tendonitis    Thyroid disease     Past Surgical History:  Procedure Laterality Date   CERVICAL FUSION     C5- C6 Feb 8. 2022   KNEE ARTHROSCOPY Right    2002, 2018   OSTEOTOMY MAXILLARY     WISDOM TOOTH EXTRACTION     x4    There were no vitals filed for this visit.    Subjective Assessment - 02/25/21 0931     Subjective Pt is s/p Lt knee arthroscopy 02/19/21. Pt has been able to walk without AD about 20-30 minutes, he is able to work full time and perform ADLs and IADLs without difficulty.    Pertinent History cervical fusion 07/2020. Rt TKA planned for 12/22    Limitations Walking    How long can you walk comfortably? 20-30 minutes    Patient Stated Goals improve strength and endurance to return to the gym    Currently in Pain? Yes    Pain Score 3     Pain Location Knee    Pain Orientation Left    Pain Descriptors / Indicators Aching;Burning    Pain Type Surgical pain    Pain Onset In the past 7 days    Pain Frequency  Intermittent    Aggravating Factors  standing, walking    Pain Relieving Factors ice, rest                [facility] PT Assessment - 02/25/21 0001       Assessment   Medical Diagnosis s/p Lt knee arthroscopy    Referring Provider (PT) Berenice Primas, John    Onset Date/Surgical Date 02/19/21    Prior Therapy PT prior to surgery      Precautions   Precautions None      Balance Screen   Has the patient fallen in the past 6 months No      Prior Function   Level of Independence Independent      Observation/Other Assessments   Focus on Therapeutic Outcomes (FOTO)  51 functional status measure      Squat   Comments pain at 90 degrees of flexion in LT knee      Single Leg Stance   Comments LT 10 sec, Rt 30 sec      ROM / Strength   AROM / PROM /  Strength AROM      AROM   Overall AROM Comments Bilat knee AROM WFL      Strength   Overall Strength Comments Lt knee grossly 4+/5, SLR with ER Lt 4/5, PF 4+/5 bilat    Right Hip Flexion 4+/5    Right Hip ABduction 4+/5    Left Hip Flexion 4/5    Left Hip ABduction 4+/5      Flexibility   Soft Tissue Assessment /Muscle Length --   Lt quad length limited by pain at incision     Palpation   Palpation comment slight swelling anterior knee around incision      Ambulation/Gait   Assistive device None    Stairs Yes    Stairs Assistance 7: Independent    Stair Management Technique Alternating pattern    Number of Stairs 6    Gait Comments slight pain in Lt knee with ascending 6'' step                        Objective measurements completed on examination: See above findings.       [place] Adult PT Treatment/Exercise - 02/25/21 0001       Knee/Hip Exercises: Standing   Heel Raises 10 reps    Lateral Step Up 10 reps    Forward Step Up 10 reps    Wall Squat 5 reps    Wall Squat Limitations quarter squat    SLS with Vectors 5 reps                    PT Education - 02/25/21 0952     Education  Details PT POC and goals, HEP    Person(s) Educated Patient    Methods Explanation;Demonstration;Handout    Comprehension Returned demonstration;Verbalized understanding                 PT Long Term Goals - 02/25/21 1011       PT LONG TERM GOAL #1   Title Pt will be independent in HEP    Time 6    Period Weeks    Status New    Target Date 04/08/21      PT LONG TERM GOAL #2   Title Pt will improve FOTO to >=78 to demo improved functional mobility    Time 6    Period Weeks    Status New    Target Date 04/08/21      PT LONG TERM GOAL #3   Title Pt will improve SLS on Lt LE to >= 30 sec to demo improved balance    Time 6    Period Weeks    Status New    Target Date 04/08/21      PT LONG TERM GOAL #4   Title Pt will return to gym routine with pain <= 1/10 in Lt knee    Time 6    Period Weeks    Status New    Target Date 04/08/21                    Plan - 02/25/21 1003     Clinical Impression Statement Pt is a 45 y/o male s/p Lt knee arthroscopy 02/19/21. Pt presents with decreased Lt knee strength and endurance, decreased balance and impaired gait and mobility. Pt will benefit from skilled PT to address deficits and improve functional mobility to progress towards prior level of function    Personal Factors and Comorbidities  Past/Current Experience;Fitness;Comorbidity 2;Profession    Examination-Activity Limitations Squat;Stairs    Examination-Participation Restrictions Occupation;Community Activity;Yard Work    Stability/Clinical Decision Making Stable/Uncomplicated    Designer, jewellery Low    Rehab Potential Good    PT Frequency 2x / week    PT Duration 6 weeks    PT Treatment/Interventions Vasopneumatic Device;Dry needling;Taping;Cryotherapy;Aquatic Therapy;Electrical Stimulation;Iontophoresis '4mg'$ /ml Dexamethasone;Moist Heat;Stair training;Gait training;Therapeutic activities;Therapeutic exercise;Balance training;Neuromuscular  re-education;Patient/family education;Manual techniques    PT Next Visit Plan assess HEP, progress strength and balance as tolerated    PT Home Exercise Plan [id number]    Consulted and Agree with Plan of Care Patient             Patient will benefit from skilled therapeutic intervention in order to improve the following deficits and impairments:  Pain, Decreased strength, Decreased activity tolerance, Increased edema, Decreased endurance, Decreased balance  Visit Diagnosis: Acute pain of left knee - Plan: PT plan of care cert/re-cert  Muscle weakness (generalized) - Plan: PT plan of care cert/re-cert  Balance problem - Plan: PT plan of care cert/re-cert  Other symptoms and signs involving the musculoskeletal system - Plan: PT plan of care cert/re-cert     Problem List Patient Active Problem List   Diagnosis Date Noted   Leg length discrepancy 12/25/2020   Numbness and tingling of both legs 11/25/2020   History of C5-C6 ACDF February 2022 10/14/2020   Primary osteoarthritis with degenerative meniscal tearing of left knee 06/11/2020   Right insertional Achilles tendinitis 04/30/2020   Right hand pain 04/30/2020   Lumbar spinal stenosis 04/30/2020   Irritable bowel syndrome with diarrhea 12/07/2019   Depression, major, single episode, mild (HCC) 12/07/2019   Mild intermittent asthma without complication [id number]   Recurrent productive cough 08/31/2018   Abnormal weight gain 07/05/2018   Non-seasonal allergic rhinitis 07/05/2018   Class 3 severe obesity due to excess calories with serious comorbidity and body mass index (BMI) of 45.0 to 49.9 in adult [facility]) 07/05/2018   Hypercalcemia 10/03/2017   Forearm tendonitis 10/03/2017   Secondary male hypogonadism 10/03/2017   Hypothyroidism 10/01/2017   Idiopathic peripheral neuropathy 10/01/2017   Primary osteoarthritis, left elbow 10/01/2017   Davi Kroon, PT  Sherese Heyward 02/25/2021, 10:17 AM  [facility] [place], [place], [address] Phone: [phone]   Fax:  [phone]  Name: Garrett Watson MRN: [id number] Date of Birth: 01-Jun-1976

## 2021-02-25 NOTE — Patient Instructions (Signed)
Access Code: [facility] URL: [email or web address] Date: 02/25/2021 Prepared by: Isabelle Course  Exercises Step Up - 1 x daily - 7 x weekly - 3 sets - 10 reps Lateral Step Up - 1 x daily - 7 x weekly - 3 sets - 10 reps Single Leg Balance with Clock Reach - 1 x daily - 7 x weekly - 2 sets - 10 reps Standing Heel Raise - 1 x daily - 7 x weekly - 3 sets - 10 reps Wall Quarter Squat - 1 x daily - 7 x weekly - 1 sets - 10 reps - 3-5 sec hold

## 2021-02-28 ENCOUNTER — Ambulatory Visit (INDEPENDENT_AMBULATORY_CARE_PROVIDER_SITE_OTHER): Payer: BC Managed Care – PPO | Admitting: Physical Therapy

## 2021-02-28 ENCOUNTER — Other Ambulatory Visit: Payer: Self-pay

## 2021-02-28 DIAGNOSIS — R2689 Other abnormalities of gait and mobility: Secondary | ICD-10-CM | POA: Diagnosis not present

## 2021-02-28 DIAGNOSIS — M6281 Muscle weakness (generalized): Secondary | ICD-10-CM

## 2021-02-28 DIAGNOSIS — M25562 Pain in left knee: Secondary | ICD-10-CM | POA: Diagnosis not present

## 2021-02-28 NOTE — Therapy (Signed)
[place], [place], [address] Phone: [phone]   Fax:  [phone]  Physical Therapy Treatment and Discharge  Patient Details  Name: Garrett Watson MRN: [id number] Date of Birth: [birth year] Referring Provider (PT): Dorna Leitz   Encounter Date: 02/28/2021   PT End of Session - 02/28/21 0910     Visit Number 2    Number of Visits 12    Date for PT Re-Evaluation 04/08/21    PT Start Time 0845    PT Stop Time 0910    PT Time Calculation (min) 25 min    Activity Tolerance Patient tolerated treatment well    Behavior During Therapy [facility] for tasks assessed/performed             Past Medical History:  Diagnosis Date   Allergy    Anxiety    Arthritis    osteoarthritis - left elbow   Cervical stenosis of spine 2021   Erectile dysfunction    Former cigarette smoker    Hypogonadism male    Hypokalemia    Neuromuscular disorder ([place])    nerapathy   Obesity    Tendonitis    Thyroid disease     Past Surgical History:  Procedure Laterality Date   CERVICAL FUSION     C5- C6 Feb 8. 2022   KNEE ARTHROSCOPY Right    2002, 2018   OSTEOTOMY MAXILLARY     WISDOM TOOTH EXTRACTION     x4    There were no vitals filed for this visit.   Subjective Assessment - 02/28/21 0848     Subjective Pt states he has been released from MD and he is planning on having his Rt knee replaced at the end of the year.    Pertinent History cervical fusion 07/2020. Rt TKA planned for 12/22    Patient Stated Goals improve strength and endurance to return to the gym    Currently in Pain? No/denies                               [facility] Adult PT Treatment/Exercise - 02/28/21 0001       Knee/Hip Exercises: Aerobic   Recumbent Bike 4 mins level 3 for warm up      Knee/Hip Exercises: Standing   Heel Raises 20 reps    Lateral Step Up Left;Hand Hold: 0;20 reps;Step Height: 6"    Forward  Step Up 20 reps;Hand Hold: 0;Step Height: 6"    Wall Squat 20 reps    Wall Squat Limitations quarter squat    SLS with Vectors 10 reps                     PT Education - 02/28/21 0909     Education Details plan for d/c. education on maintaining strength and flexibility prior to knee replacement later this year    Person(s) Educated Patient    Methods Explanation    Comprehension Verbalized understanding                 PT Long Term Goals - 02/28/21 0912       PT LONG TERM GOAL #1   Title Pt will be independent in HEP    Status Achieved      PT LONG TERM GOAL #2   Title Pt will improve FOTO to >=78 to demo improved functional mobility    Status Not  Met      PT LONG TERM GOAL #3   Title Pt will improve SLS on Lt LE to >= 30 sec to demo improved balance    Baseline 23 sec    Status Not Met      PT LONG TERM GOAL #4   Title Pt will return to gym routine with pain <= 1/10 in Lt knee    Status Not Met                   Plan - 02/28/21 0911     Clinical Impression Statement Pt was cleared by MD to return to work out program at the gym. Session focused on pt education on safety with return to the gym and continued training on balance and strength. Pt wtih good understanding. Pt feels ready to d/c to HEP at this time    PT Next Visit Plan d/c    PT Home Exercise Plan [facility]    Consulted and Agree with Plan of Care Patient             Patient will benefit from skilled therapeutic intervention in order to improve the following deficits and impairments:     Visit Diagnosis: Acute pain of left knee  Muscle weakness (generalized)  Balance problem     Problem List Patient Active Problem List   Diagnosis Date Noted   Leg length discrepancy 12/25/2020   Numbness and tingling of both legs 11/25/2020   History of C5-C6 ACDF February 2022 10/14/2020   Primary osteoarthritis with degenerative meniscal tearing of left knee 06/11/2020    Right insertional Achilles tendinitis 04/30/2020   Right hand pain 04/30/2020   Lumbar spinal stenosis 04/30/2020   Irritable bowel syndrome with diarrhea 12/07/2019   Depression, major, single episode, mild ([place]) 12/07/2019   Mild intermittent asthma without complication 75/17/0017   Recurrent productive cough 08/31/2018   Abnormal weight gain 07/05/2018   Non-seasonal allergic rhinitis 07/05/2018   Class 3 severe obesity due to excess calories with serious comorbidity and body mass index (BMI) of 45.0 to 49.9 in adult [facility]) 07/05/2018   Hypercalcemia 10/03/2017   Forearm tendonitis 10/03/2017   Secondary male hypogonadism 10/03/2017   Hypothyroidism 10/01/2017   Idiopathic peripheral neuropathy 10/01/2017   Primary osteoarthritis, left elbow 10/01/2017   PHYSICAL THERAPY DISCHARGE SUMMARY  Visits from Start of Care: 2  Current functional level related to goals / functional outcomes: Decreased pain, improving balance   Remaining deficits: See above   Education / Equipment: HEP   Patient agrees to discharge. Patient goals were partially met. Patient is being discharged due to being pleased with the current functional level.  Demere Dotzler, PT 02/28/2021, 9:13 AM  [facility] [place], [place], [address] Phone: [phone]   Fax:  [phone]  Name: Garrett Watson MRN: [id number] Date of Birth: [birth year]

## 2021-03-04 ENCOUNTER — Encounter: Payer: BC Managed Care – PPO | Admitting: Physical Therapy

## 2021-03-07 ENCOUNTER — Encounter: Payer: BC Managed Care – PPO | Admitting: Physical Therapy

## 2021-03-19 ENCOUNTER — Other Ambulatory Visit: Payer: Self-pay | Admitting: Sports Medicine

## 2021-03-19 DIAGNOSIS — M7661 Achilles tendinitis, right leg: Secondary | ICD-10-CM

## 2021-03-20 ENCOUNTER — Other Ambulatory Visit: Payer: Self-pay | Admitting: Family Medicine

## 2021-03-20 ENCOUNTER — Telehealth: Payer: Self-pay

## 2021-03-20 DIAGNOSIS — G609 Hereditary and idiopathic neuropathy, unspecified: Secondary | ICD-10-CM

## 2021-03-20 NOTE — Telephone Encounter (Signed)
Routing to covering provider. Patient left a vm msg requesting med refill for pregabalin. Rx pended.

## 2021-03-21 MED ORDER — PREGABALIN 75 MG PO CAPS: 75.0000 mg | ORAL_CAPSULE | Freq: Three times a day (TID) | ORAL | 3 refills | Status: DC

## 2021-03-27 ENCOUNTER — Telehealth: Payer: Self-pay

## 2021-03-27 NOTE — Telephone Encounter (Signed)
Pt called stating that he is having knee replacement surgery on 05/07/2021 and that the surgeon's office was sending over some paperwork and lab orders to be completed between 04/02/2021 and 04/11/2021.  LVM for pt to return call.  We have not received any paperwork or lab orders from surgeon's office.  Pt will likely need OV since he

## 2021-03-27 NOTE — Telephone Encounter (Signed)
Pt returned call.  Advised pt that he will need OV to establish with Dr. Zigmund Daniel for surgical clearance.  Pt given OV for 04/04/2021.  Pt also stated that his surgeon has set up the labs to be drawn at [facility] and he does not need these done at our office.  Charyl Bigger, CMA

## 2021-04-03 ENCOUNTER — Other Ambulatory Visit: Payer: Self-pay | Admitting: Family Medicine

## 2021-04-03 DIAGNOSIS — G609 Hereditary and idiopathic neuropathy, unspecified: Secondary | ICD-10-CM

## 2021-04-04 ENCOUNTER — Other Ambulatory Visit: Payer: Self-pay

## 2021-04-04 ENCOUNTER — Ambulatory Visit (INDEPENDENT_AMBULATORY_CARE_PROVIDER_SITE_OTHER): Payer: BC Managed Care – PPO | Admitting: Family Medicine

## 2021-04-04 VITALS — BP 134/80 | HR 79 | Temp 98.0°F | Ht 70.0 in | Wt 270.9 lb

## 2021-04-04 DIAGNOSIS — Z23 Encounter for immunization: Secondary | ICD-10-CM | POA: Diagnosis not present

## 2021-04-04 DIAGNOSIS — Z01818 Encounter for other preprocedural examination: Secondary | ICD-10-CM

## 2021-04-06 ENCOUNTER — Encounter: Payer: Self-pay | Admitting: Family Medicine

## 2021-04-06 DIAGNOSIS — Z01818 Encounter for other preprocedural examination: Secondary | ICD-10-CM | POA: Insufficient documentation

## 2021-04-06 NOTE — Progress Notes (Signed)
Garrett Watson - 45 y.o. male MRN [id number]  Date of birth: 1976-05-09  Subjective Chief Complaint  Patient presents with   Procedure    HPI Garrett Watson is a 45 year old male here today for preoperative clearance.  He has history of hypothyroidism, asthma and chronic allergies.  He is planning on having elective knee replacement.  He has no prior complications from anesthesia.  He has no cardiac disorders.  His asthma is well controlled.  He is not a current smoker.  He has not had chest pain or increased dyspnea.  ROS:  A comprehensive ROS was completed and negative except as noted per HPI  Allergies  Allergen Reactions   Other Cough    Patient reports reaction was when he was a baby    Robitussin 12 Hour Cough [Dextromethorphan Polistirex Er] Hives   Phentermine Other (See Comments)    Irritability, increased heart rate    Past Medical History:  Diagnosis Date   Allergy    Anxiety    Arthritis    osteoarthritis - left elbow   Cervical stenosis of spine 2021   Erectile dysfunction    Former cigarette smoker    Hypogonadism male    Hypokalemia    Neuromuscular disorder (HCC)    nerapathy   Obesity    Tendonitis    Thyroid disease     Past Surgical History:  Procedure Laterality Date   CERVICAL FUSION     C5- C6 Feb 8. 2022   KNEE ARTHROSCOPY Right    2002, 2018   OSTEOTOMY MAXILLARY     WISDOM TOOTH EXTRACTION     x4    Social History   Socioeconomic History   Marital status: Single    Spouse name: Not on file   Number of children: Not on file   Years of education: Not on file   Highest education level: Not on file  Occupational History   Not on file  Tobacco Use   Smoking status: Former    Types: Cigarettes    Quit date: 08/12/2015    Years since quitting: 5.6   Smokeless tobacco: Never  Vaping Use   Vaping Use: Former  Substance and Sexual Activity   Alcohol use: Yes    Comment: rarely   Drug use: Not Currently    Types: Marijuana     Comment: Uses CBD   Sexual activity: Not Currently  Other Topics Concern   Not on file  Social History Narrative   Not on file   Social Determinants of Health   Financial Resource Strain: Not on file  Food Insecurity: Not on file  Transportation Needs: Not on file  Physical Activity: Not on file  Stress: Not on file  Social Connections: Not on file    Family History  Problem Relation Age of Onset   Alcohol abuse Father    Hyperlipidemia Father    Hypertension Father    Stroke Father    Alcohol abuse Brother    Stroke Paternal Aunt    Diabetes Paternal Aunt    Diabetes Paternal Uncle    Kidney cancer Maternal Grandmother    Lung cancer Maternal Grandfather    Alcohol abuse Brother    Diabetes Cousin    Depression Cousin    Colon polyps Mother    Colon cancer Neg Hx    Stomach cancer Neg Hx    Esophageal cancer Neg Hx    Rectal cancer Neg Hx     Health Maintenance  Topic  Date Due   HIV Screening  Never done   COVID-19 Vaccine (3 - Booster for Moderna series) 05/04/2020   TETANUS/TDAP  12/05/2025   INFLUENZA VACCINE  Completed   Hepatitis C Screening  Completed   HPV VACCINES  Aged Out     ----------------------------------------------------------------------------------------------------------------------------------------------------------------------------------------------------------------- Physical Exam BP 134/80 (BP Location: Left Arm, Patient Position: Sitting, Cuff Size: Large)   Pulse 79   Temp 98 F (36.7 C)   Ht 5\' 10"  (1.778 m)   Wt 270 lb 14.4 oz (122.9 kg)   SpO2 99%   BMI 38.87 kg/m   Physical Exam Constitutional:      Appearance: Normal appearance.  HENT:     Head: Normocephalic and atraumatic.  Eyes:     General: No scleral icterus. Cardiovascular:     Rate and Rhythm: Normal rate and regular rhythm.  Pulmonary:     Effort: Pulmonary effort is normal.     Breath sounds: Normal breath sounds.  Musculoskeletal:     Cervical back:  Neck supple.  Neurological:     General: No focal deficit present.     Mental Status: He is alert.  Psychiatric:        Mood and Affect: Mood normal.        Behavior: Behavior normal.    ------------------------------------------------------------------------------------------------------------------------------------------------------------------------------------------------------------------- Assessment and Plan  Preoperative clearance He has low risk for cardiac complications related to elective knee replacement.  No prior complications related to anesthesia.  He may proceed with surgery as planned.   No orders of the defined types were placed in this encounter.   No follow-ups on file.    This visit occurred during the SARS-CoV-2 public health emergency.  Safety protocols were in place, including screening questions prior to the visit, additional usage of staff PPE, and extensive cleaning of exam room while observing appropriate contact time as indicated for disinfecting solutions.

## 2021-04-06 NOTE — Assessment & Plan Note (Signed)
He has low risk for cardiac complications related to elective knee replacement.  No prior complications related to anesthesia.  He may proceed with surgery as planned.

## 2021-05-14 ENCOUNTER — Encounter: Payer: Self-pay | Admitting: Family Medicine

## 2021-05-14 DIAGNOSIS — J3089 Other allergic rhinitis: Secondary | ICD-10-CM

## 2021-05-14 DIAGNOSIS — E038 Other specified hypothyroidism: Secondary | ICD-10-CM

## 2021-05-14 MED ORDER — LEVOTHYROXINE SODIUM 25 MCG PO TABS: ORAL_TABLET | ORAL | 0 refills | Status: DC

## 2021-05-14 MED ORDER — MONTELUKAST SODIUM 10 MG PO TABS: ORAL_TABLET | ORAL | 0 refills | Status: DC

## 2021-05-20 ENCOUNTER — Other Ambulatory Visit: Payer: Self-pay | Admitting: Osteopathic Medicine

## 2021-05-20 DIAGNOSIS — J3089 Other allergic rhinitis: Secondary | ICD-10-CM

## 2021-05-20 DIAGNOSIS — E038 Other specified hypothyroidism: Secondary | ICD-10-CM

## 2021-05-26 ENCOUNTER — Other Ambulatory Visit: Payer: Self-pay | Admitting: Osteopathic Medicine

## 2021-06-19 ENCOUNTER — Ambulatory Visit (INDEPENDENT_AMBULATORY_CARE_PROVIDER_SITE_OTHER): Payer: BC Managed Care – PPO | Admitting: Sports Medicine

## 2021-06-19 ENCOUNTER — Ambulatory Visit (INDEPENDENT_AMBULATORY_CARE_PROVIDER_SITE_OTHER): Payer: BC Managed Care – PPO

## 2021-06-19 ENCOUNTER — Other Ambulatory Visit: Payer: Self-pay

## 2021-06-19 DIAGNOSIS — S46012A Strain of muscle(s) and tendon(s) of the rotator cuff of left shoulder, initial encounter: Secondary | ICD-10-CM

## 2021-06-19 DIAGNOSIS — M75102 Unspecified rotator cuff tear or rupture of left shoulder, not specified as traumatic: Secondary | ICD-10-CM | POA: Insufficient documentation

## 2021-06-19 NOTE — Assessment & Plan Note (Signed)
Pleasant 45 year old male, was working out in the gym and felt a pop, immediate weakness in the left shoulder, on exam he has significant weakness to external rotation concerning for a tear of the infraspinatus, due to severity of weakness we will get x-rays, as well as an MRI of the shoulder, if tear confirmed we will send him to Dr. Griffin Basil.

## 2021-06-19 NOTE — Progress Notes (Signed)
° ° °  Procedures performed today:    None.  Independent interpretation of notes and tests performed by another provider:   None.  Brief History, Exam, Impression, and Recommendations:    Rotator cuff tear, left Pleasant 45 year old male, was working out in the gym and felt a pop, immediate weakness in the left shoulder, on exam he has significant weakness to external rotation concerning for a tear of the infraspinatus, due to severity of weakness we will get x-rays, as well as an MRI of the shoulder, if tear confirmed we will send him to Dr. Griffin Basil.    ___________________________________________ Gwen Her. Dianah Field, M.D., ABFM., CAQSM. Primary Care and Blanket Instructor of [place] [facility]

## 2021-06-21 ENCOUNTER — Ambulatory Visit (INDEPENDENT_AMBULATORY_CARE_PROVIDER_SITE_OTHER): Payer: BC Managed Care – PPO

## 2021-06-21 ENCOUNTER — Other Ambulatory Visit: Payer: Self-pay

## 2021-06-21 DIAGNOSIS — S46012A Strain of muscle(s) and tendon(s) of the rotator cuff of left shoulder, initial encounter: Secondary | ICD-10-CM | POA: Diagnosis not present

## 2021-06-26 ENCOUNTER — Ambulatory Visit: Payer: BC Managed Care – PPO | Admitting: Family Medicine

## 2021-07-04 ENCOUNTER — Other Ambulatory Visit: Payer: Self-pay

## 2021-07-04 ENCOUNTER — Ambulatory Visit: Payer: BC Managed Care – PPO | Admitting: Family Medicine

## 2021-07-04 ENCOUNTER — Encounter: Payer: Self-pay | Admitting: Family Medicine

## 2021-07-04 VITALS — BP 123/68 | HR 80 | Temp 98.0°F | Ht 70.0 in | Wt 279.0 lb

## 2021-07-04 DIAGNOSIS — J3089 Other allergic rhinitis: Secondary | ICD-10-CM | POA: Diagnosis not present

## 2021-07-04 DIAGNOSIS — E038 Other specified hypothyroidism: Secondary | ICD-10-CM | POA: Diagnosis not present

## 2021-07-04 DIAGNOSIS — K580 Irritable bowel syndrome with diarrhea: Secondary | ICD-10-CM

## 2021-07-04 DIAGNOSIS — G609 Hereditary and idiopathic neuropathy, unspecified: Secondary | ICD-10-CM | POA: Diagnosis not present

## 2021-07-04 MED ORDER — PREGABALIN 75 MG PO CAPS: 75.0000 mg | ORAL_CAPSULE | Freq: Three times a day (TID) | ORAL | 1 refills | Status: DC

## 2021-07-04 MED ORDER — PANTOPRAZOLE SODIUM 40 MG PO TBEC: 40.0000 mg | DELAYED_RELEASE_TABLET | Freq: Every morning | ORAL | 3 refills | Status: DC

## 2021-07-04 MED ORDER — MONTELUKAST SODIUM 10 MG PO TABS: ORAL_TABLET | ORAL | 1 refills | Status: DC

## 2021-07-04 MED ORDER — DICYCLOMINE HCL 20 MG PO TABS: ORAL_TABLET | ORAL | 3 refills | Status: DC

## 2021-07-04 MED ORDER — LEVOTHYROXINE SODIUM 25 MCG PO TABS: ORAL_TABLET | ORAL | 1 refills | Status: DC

## 2021-07-04 NOTE — Progress Notes (Signed)
Established Patient Office Visit  Subjective:  Patient ID: Garrett Watson, male    DOB: [birth year]  Age: 46 y.o. MRN: [id number]  CC:  Chief Complaint  Patient presents with   Medication Refill    HPI Garrett Watson presents for medication refills.   Allergies: Used to have frequent URIs. Has been on OTC antihistmaine, inhalers, plus Singulair. Doing really well. No recent sickness. Needs Singulair refilled.   IBS: Bentyl helping significantly. Compliant with lifestyle management. Reports someone also put him on Protonix for this, but he is not having any GERD symptoms. States he would like it refilled, but plans to not use it continuously as long as stopping does not trigger symptom return.   Hypothyroidism: Doing well overall. States he has had some weight gain and fatigue the past few months, but is unsure if that is related to recent surgery and limited mobility. He denies any chest pain, shortness of breath Some weight gain since surgery - less active. Slight fatigue - not sure if postop related. No cp, sob, palpitates, hakr/skin/nail changes  Peripheral neuropathy (not DM related): Lyrica working well. No complaints. Tries to take only twice a day if he can manage. Symptoms seem to be worse if he has been very active that day.      Past Medical History:  Diagnosis Date   Allergy    Anxiety    Arthritis    osteoarthritis - left elbow   Cervical stenosis of spine 2021   Erectile dysfunction    Former cigarette smoker    Hypogonadism male    Hypokalemia    Neuromuscular disorder (HCC)    nerapathy   Obesity    Tendonitis    Thyroid disease     Past Surgical History:  Procedure Laterality Date   CERVICAL FUSION     C5- C6 Feb 8. 2022   KNEE ARTHROSCOPY Right    2002, 2018   OSTEOTOMY MAXILLARY     WISDOM TOOTH EXTRACTION     x4    Family History  Problem Relation Age of Onset   Alcohol abuse Father    Hyperlipidemia Father     Hypertension Father    Stroke Father    Alcohol abuse Brother    Stroke Paternal Aunt    Diabetes Paternal Aunt    Diabetes Paternal Uncle    Kidney cancer Maternal Grandmother    Lung cancer Maternal Grandfather    Alcohol abuse Brother    Diabetes Cousin    Depression Cousin    Colon polyps Mother    Colon cancer Neg Hx    Stomach cancer Neg Hx    Esophageal cancer Neg Hx    Rectal cancer Neg Hx     Social History   Socioeconomic History   Marital status: Single    Spouse name: Not on file   Number of children: Not on file   Years of education: Not on file   Highest education level: Not on file  Occupational History   Not on file  Tobacco Use   Smoking status: Former    Types: Cigarettes    Quit date: 08/12/2015    Years since quitting: 5.8   Smokeless tobacco: Never  Vaping Use   Vaping Use: Former  Substance and Sexual Activity   Alcohol use: Yes    Comment: rarely   Drug use: Not Currently    Types: Marijuana    Comment: Uses CBD   Sexual activity: Not Currently  Other Topics Concern   Not on file  Social History Narrative   Not on file   Social Determinants of Health   Financial Resource Strain: Not on file  Food Insecurity: Not on file  Transportation Needs: Not on file  Physical Activity: Not on file  Stress: Not on file  Social Connections: Not on file  Intimate Partner Violence: Not on file    Outpatient Medications Prior to Visit  Medication Sig Dispense Refill   amitriptyline (ELAVIL) 25 MG tablet      aspirin 325 MG EC tablet      celecoxib (CELEBREX) 200 MG capsule      fluticasone (FLONASE) 50 MCG/ACT nasal spray Place 1 spray into both nostrils daily. 48 g 3   Multiple Vitamins-Minerals (CENTRUM ADULTS PO) Take by mouth.     nitroGLYCERIN (NITRODUR - DOSED IN MG/24 HR) 0.2 mg/hr patch Cut and apply 1/4 patch to most painful area q24h. 30 patch 11   NON FORMULARY BCP157(peptide)     NONFORMULARY OR COMPOUNDED ITEM B-NOX Pre-workout  formula     SYMBICORT 160-4.5 MCG/ACT inhaler TAKE 2 PUFFS BY MOUTH TWICE A DAY 30.6 each 3   dicyclomine (BENTYL) 20 MG tablet Take 1 tablet three to four times daily 90 tablet 3   levothyroxine (SYNTHROID) 25 MCG tablet TAKE 1 TABLET BY MOUTH EVERY DAY BEFORE BREAKFAST.  OFFICE VISIT REQUIRED PRIOR TO ANY FURTHER REFILLS 30 tablet 0   montelukast (SINGULAIR) 10 MG tablet TAKE 1 TABLET BY MOUTH EVERYDAY AT BEDTIME.  OFFICE VISIT REQUIRED PRIOR TO ANY FURTHER REFILLS 30 tablet 0   pantoprazole (PROTONIX) 40 MG tablet Take 1 tablet (40 mg total) by mouth in the morning. 30 tablet 11   pregabalin (LYRICA) 75 MG capsule Take 1 capsule (75 mg total) by mouth 3 (three) times daily. 270 capsule 3   Facility-Administered Medications Prior to Visit  Medication Dose Route Frequency Provider Last Rate Last Admin   0.9 %  sodium chloride infusion  500 mL Intravenous Once Cirigliano, Vito V, DO        Allergies  Allergen Reactions   Other Cough    Patient reports reaction was when he was a baby    Robitussin 12 Hour Cough [Dextromethorphan Polistirex Er] Hives   Phentermine Other (See Comments)    Irritability, increased heart rate    ROS Review of Systems All review of systems negative except what is listed in the HPI    Objective:    Physical Exam Vitals reviewed.  Constitutional:      Appearance: Normal appearance. He is obese.  HENT:     Head: Normocephalic and atraumatic.  Cardiovascular:     Rate and Rhythm: Normal rate and regular rhythm.     Pulses: Normal pulses.     Heart sounds: Normal heart sounds.  Pulmonary:     Effort: Pulmonary effort is normal.     Breath sounds: Normal breath sounds.  Musculoskeletal:     Cervical back: Normal range of motion and neck supple. No tenderness.     Right lower leg: No edema.     Left lower leg: No edema.  Lymphadenopathy:     Cervical: No cervical adenopathy.  Skin:    General: Skin is warm and dry.  Neurological:     General: No  focal deficit present.     Mental Status: He is alert and oriented to person, place, and time. Mental status is at baseline.  Psychiatric:  Mood and Affect: Mood normal.        Behavior: Behavior normal.        Thought Content: Thought content normal.        Judgment: Judgment normal.    BP 123/68 (BP Location: Left Arm, Patient Position: Sitting, Cuff Size: Large)    Pulse 80    Temp 98 F (36.7 C) (Oral)    Ht 5\' 10"  (1.778 m)    Wt 279 lb (126.6 kg)    SpO2 99%    BMI 40.03 kg/m  Wt Readings from Last 3 Encounters:  07/04/21 279 lb (126.6 kg)  04/04/21 270 lb 14.4 oz (122.9 kg)  01/06/21 290 lb (131.5 kg)     Health Maintenance Due  Topic Date Due   HIV Screening  Never done    There are no preventive care reminders to display for this patient.  Lab Results  Component Value Date   TSH 2.03 12/07/2019   Lab Results  Component Value Date   WBC 5.0 12/07/2019   HGB 15.7 12/07/2019   HCT 44.9 12/07/2019   MCV 87.9 12/07/2019   PLT 180 12/07/2019   Lab Results  Component Value Date   NA 138 12/07/2019   K 4.2 12/07/2019   CO2 28 12/07/2019   GLUCOSE 81 12/07/2019   BUN 24 12/07/2019   CREATININE 0.89 12/07/2019   BILITOT 0.8 12/07/2019   AST 21 12/07/2019   ALT 23 12/07/2019   PROT 6.8 12/07/2019   CALCIUM 9.7 12/07/2019   Lab Results  Component Value Date   CHOL 189 12/07/2019   Lab Results  Component Value Date   HDL 38 (L) 12/07/2019   Lab Results  Component Value Date   LDLCALC 122 (H) 12/07/2019   Lab Results  Component Value Date   TRIG 169 (H) 12/07/2019   Lab Results  Component Value Date   CHOLHDL 5.0 (H) 12/07/2019   Lab Results  Component Value Date   HGBA1C 5.0 12/07/2019      Assessment & Plan:   1. Subclinical hypothyroidism States he just had labs done pre-op about 2 months ago. Agreeable to try for at least TSH and CMP, unless lab estimates expensive then he will send [place] results from last draw and wants to wait until  CPE for lab draw. Refill placed at current dose for now.  - levothyroxine (SYNTHROID) 25 MCG tablet; TAKE 1 TABLET BY MOUTH EVERY DAY BEFORE BREAKFAST.  Dispense: 90 tablet; Refill: 1 - TSH - Comprehensive metabolic panel  2. Non-seasonal allergic rhinitis, unspecified trigger No complaints. Refill placed.  - montelukast (SINGULAIR) 10 MG tablet; TAKE 1 TABLET BY MOUTH EVERYDAY AT BEDTIME.  Dispense: 90 tablet; Refill: 1  3. Idiopathic peripheral neuropathy Continue current dose. - pregabalin (LYRICA) 75 MG capsule; Take 1 capsule (75 mg total) by mouth 3 (three) times daily.  Dispense: 90 capsule; Refill: 1  4. Irritable bowel syndrome with diarrhea Continue Bentyl. He can try to start reducing Protonix use. - dicyclomine (BENTYL) 20 MG tablet; Take 1 tablet three to four times daily  Dispense: 90 tablet; Refill: 3 - pantoprazole (PROTONIX) 40 MG tablet; Take 1 tablet (40 mg total) by mouth in the morning.  Dispense: 30 tablet; Refill: 3     Follow-up: Return in about 2 months (around 09/01/2021) for CPE with PCP.    Terrilyn Saver, NP

## 2021-07-04 NOTE — Patient Instructions (Signed)
TSH and metabolic panel checked today  Refills placed Please schedule upcoming physical with PCP

## 2021-07-05 LAB — COMPREHENSIVE METABOLIC PANEL
AG Ratio: 1.9 (calc) (ref 1.0–2.5)
ALT: 20 U/L (ref 9–46)
AST: 24 U/L (ref 10–40)
Albumin: 4.4 g/dL (ref 3.6–5.1)
Alkaline phosphatase (APISO): 36 U/L (ref 36–130)
BUN: 19 mg/dL (ref 7–25)
CO2: 28 mmol/L (ref 20–32)
Calcium: 9.9 mg/dL (ref 8.6–10.3)
Chloride: 104 mmol/L (ref 98–110)
Creat: 1.02 mg/dL (ref 0.60–1.29)
Globulin: 2.3 g/dL (calc) (ref 1.9–3.7)
Glucose, Bld: 85 mg/dL (ref 65–99)
Potassium: 4.2 mmol/L (ref 3.5–5.3)
Sodium: 139 mmol/L (ref 135–146)
Total Bilirubin: 0.7 mg/dL (ref 0.2–1.2)
Total Protein: 6.7 g/dL (ref 6.1–8.1)

## 2021-07-05 LAB — TSH: TSH: 2.05 mIU/L (ref 0.40–4.50)

## 2021-07-23 ENCOUNTER — Other Ambulatory Visit: Payer: Self-pay

## 2021-07-23 ENCOUNTER — Ambulatory Visit: Payer: BC Managed Care – PPO | Admitting: Family Medicine

## 2021-07-23 ENCOUNTER — Encounter: Payer: Self-pay | Admitting: Family Medicine

## 2021-07-23 DIAGNOSIS — R41840 Attention and concentration deficit: Secondary | ICD-10-CM | POA: Insufficient documentation

## 2021-07-23 MED ORDER — LISDEXAMFETAMINE DIMESYLATE 30 MG PO CAPS: 30.0000 mg | ORAL_CAPSULE | Freq: Every day | ORAL | 0 refills | Status: DC

## 2021-07-23 NOTE — Progress Notes (Signed)
Garrett Watson - 46 y.o. male MRN [id number]  Date of birth: 1976-05-15  Subjective Chief Complaint  Patient presents with   Anxiety    HPI Gerald Stabs is a 46 year old male here today here today to discuss difficulty with concentration, feeling more energetic and driven by motor, and some difficulty with sleep.  He has had this throughout most of his life with worsening here recently.  No previous diagnosis of ADD or other psychiatric illness.  There is a family history of obsessive-compulsive disorder in his brother.  He does feel that symptoms have affected his work performance as well and this created some anxiety for him.  ASR S with score of 5 for section A and 10 items under "very often" in section B.  ROS:  A comprehensive ROS was completed and negative except as noted per HPI  Allergies  Allergen Reactions   Other Cough    Patient reports reaction was when he was a baby    Robitussin 12 Hour Cough [Dextromethorphan Polistirex Er] Hives   Phentermine Other (See Comments)    Irritability, increased heart rate    Past Medical History:  Diagnosis Date   Allergy    Anxiety    Arthritis    osteoarthritis - left elbow   Cervical stenosis of spine 2021   Erectile dysfunction    Former cigarette smoker    Hypogonadism male    Hypokalemia    Neuromuscular disorder (HCC)    nerapathy   Obesity    Tendonitis    Thyroid disease     Past Surgical History:  Procedure Laterality Date   CERVICAL FUSION     C5- C6 Feb 8. 2022   KNEE ARTHROSCOPY Right    2002, 2018   OSTEOTOMY MAXILLARY     WISDOM TOOTH EXTRACTION     x4    Social History   Socioeconomic History   Marital status: Single    Spouse name: Not on file   Number of children: Not on file   Years of education: Not on file   Highest education level: Not on file  Occupational History   Not on file  Tobacco Use   Smoking status: Former    Types: Cigarettes    Quit date: 08/12/2015    Years since  quitting: 5.9   Smokeless tobacco: Never  Vaping Use   Vaping Use: Former  Substance and Sexual Activity   Alcohol use: Yes    Comment: rarely   Drug use: Not Currently    Types: Marijuana    Comment: Uses CBD   Sexual activity: Not Currently  Other Topics Concern   Not on file  Social History Narrative   Not on file   Social Determinants of Health   Financial Resource Strain: Not on file  Food Insecurity: Not on file  Transportation Needs: Not on file  Physical Activity: Not on file  Stress: Not on file  Social Connections: Not on file    Family History  Problem Relation Age of Onset   Alcohol abuse Father    Hyperlipidemia Father    Hypertension Father    Stroke Father    Alcohol abuse Brother    Stroke Paternal Aunt    Diabetes Paternal Aunt    Diabetes Paternal Uncle    Kidney cancer Maternal Grandmother    Lung cancer Maternal Grandfather    Alcohol abuse Brother    Diabetes Cousin    Depression Cousin    Colon polyps Mother  Colon cancer Neg Hx    Stomach cancer Neg Hx    Esophageal cancer Neg Hx    Rectal cancer Neg Hx     Health Maintenance  Topic Date Due   HIV Screening  Never done   COVID-19 Vaccine (3 - Booster for Moderna series) 01/28/2020   TETANUS/TDAP  12/05/2025   INFLUENZA VACCINE  Completed   Hepatitis C Screening  Completed   HPV VACCINES  Aged Out     ----------------------------------------------------------------------------------------------------------------------------------------------------------------------------------------------------------------- Physical Exam BP 132/88 (BP Location: Left Arm, Patient Position: Sitting, Cuff Size: Large)    Pulse 90    Ht 5\' 10"  (1.778 m)    Wt 282 lb (127.9 kg)    SpO2 98%    BMI 40.46 kg/m   Physical Exam Constitutional:      Appearance: Normal appearance.  Eyes:     General: No scleral icterus. Cardiovascular:     Rate and Rhythm: Normal rate and regular rhythm.  Pulmonary:      Effort: Pulmonary effort is normal.     Breath sounds: Normal breath sounds.  Musculoskeletal:     Cervical back: Neck supple.  Neurological:     Mental Status: He is alert.  Psychiatric:        Mood and Affect: Mood normal.        Behavior: Behavior normal.    ------------------------------------------------------------------------------------------------------------------------------------------------------------------------------------------------------------------- Assessment and Plan  Attention and concentration deficit Positive ASRS screening.  Discussed trial of stimulant medication to see if this is helpful for his symptoms.  Adding Vyvanse at 30 mg daily.  Follow-up in 1 month.  Discussed that if this is not helpful would recommend referral to psychology for formal testing.   Meds ordered this encounter  Medications   lisdexamfetamine (VYVANSE) 30 MG capsule    Sig: Take 1 capsule (30 mg total) by mouth daily.    Dispense:  30 capsule    Refill:  0    Return in about 1 month (around 08/20/2021) for ADD.    This visit occurred during the SARS-CoV-2 public health emergency.  Safety protocols were in place, including screening questions prior to the visit, additional usage of staff PPE, and extensive cleaning of exam room while observing appropriate contact time as indicated for disinfecting solutions.

## 2021-07-23 NOTE — Assessment & Plan Note (Signed)
Positive ASRS screening.  Discussed trial of stimulant medication to see if this is helpful for his symptoms.  Adding Vyvanse at 30 mg daily.  Follow-up in 1 month.  Discussed that if this is not helpful would recommend referral to psychology for formal testing.

## 2021-07-23 NOTE — Patient Instructions (Signed)
Living With Attention Deficit Hyperactivity Disorder If you have been diagnosed with attention deficit hyperactivity disorder (ADHD), you may be relieved that you now know why you have felt or behaved a certain way. Still, you may feel overwhelmed about the treatment ahead. You may also wonder how to get the support you need and how to deal with the condition day-to-day. With treatment and support, you can live with ADHD and manage your symptoms. How to manage lifestyle changes Managing stress Stress is your body's reaction to life changes and events, both good and bad. To cope with the stress of an ADHD diagnosis, it may help to: Learn more about ADHD. Exercise regularly. Even a short daily walk can lower stress levels. Participate in training or education programs (including social skills training classes) that teach you to deal with symptoms.  Medicines Your health care provider may suggest certain medicines if he or she feels that they will help to improve your condition. Stimulant medicines are usually prescribed to treat ADHD, and therapy may also be prescribed. It is important to: Avoid using alcohol and other substances that may prevent your medicines from working properly (may interact). Talk with your pharmacist or health care provider about all the medicines that you take, their possible side effects, and what medicines are safe to take together. Make it your goal to take part in all treatment decisions (shared decision-making). Ask about possible side effects of medicines that your health care provider recommends, and tell him or her how you feel about having those side effects. It is best if shared decision-making with your health care provider is part of your total treatment plan. Relationships To strengthen your relationships with family members while treating your condition, consider taking part in family therapy. You might also attend self-help groups alone or with a loved one. Be  honest about how your symptoms affect your relationships. Make an effort to communicate respectfully instead of fighting, and find ways to show others that you care. Psychotherapy may be useful in helping you cope with how ADHD affects your relationships. How to recognize changes in your condition The following signs may mean that your treatment is working well and your condition is improving: Consistently being on time for appointments. Being more organized at home and work. Other people noticing improvements in your behavior. Achieving goals that you set for yourself. Thinking more clearly. The following signs may mean that your treatment is not working very well: Feeling impatience or more confusion. Missing, forgetting, or being late for appointments. An increasing sense of disorganization and messiness. More difficulty in reaching goals that you set for yourself. Loved ones becoming angry or frustrated with you. Follow these instructions at home: Take over-the-counter and prescription medicines only as told by your health care provider. Check with your health care provider before taking any new medicines. Create structure and an organized atmosphere at home. For example: Make a list of tasks, then rank them from most important to least important. Work on one task at a time until your listed tasks are done. Make a daily schedule and follow it consistently every day. Use an appointment calendar, and check it 2 or 3 times a day to keep on track. Keep it with you when you leave the house. Create spaces where you keep certain things, and always put things back in their places after you use them. Keep all follow-up visits as told by your health care provider. This is important. Where to find support Talking to others    Keep emotion out of important discussions and speak in a calm, logical way. Listen closely and patiently to your loved ones. Try to understand their point of view, and try to  avoid getting defensive. Take responsibility for the consequences of your actions. Ask that others do not take your behaviors personally. Aim to solve problems as they come up, and express your feelings instead of bottling them up. Talk openly about what you need from your loved ones and how they can support you. Consider going to family therapy sessions or having your family meet with a specialist who deals with ADHD-related behavior problems. Finances Not all insurance plans cover mental health care, so it is important to check with your insurance carrier. If paying for co-pays or counseling services is a problem, search for a local or county mental health care center. Public mental health care services may be offered there at a low cost or no cost when you are not able to see a private health care provider. If you are taking medicine for ADHD, you may be able to get the generic form, which may be less expensive than brand-name medicine. Some makers of prescription medicines also offer help to patients who cannot afford the medicines that they need. Questions to ask your health care provider: What are the risks and benefits of taking medicines? Would I benefit from therapy? How often should I follow up with a health care provider? Contact a health care provider if: You have side effects from your medicines, such as: Repeated muscle twitches, coughing, or speech outbursts. Sleep problems. Loss of appetite. Depression. New or worsening behavior problems. Dizziness. Unusually fast heartbeat. Stomach pains. Headaches. Get help right away if: You have a severe reaction to a medicine. Your behavior suddenly gets worse. Summary With treatment and support, you can live with ADHD and manage your symptoms. The medicines that are most often prescribed for ADHD are stimulants. Consider taking part in family therapy or self-help groups with family members or friends. When you talk with friends  and family about your ADHD, be patient and communicate openly. Take over-the-counter and prescription medicines only as told by your health care provider. Check with your health care provider before taking any new medicines. This information is not intended to replace advice given to you by your health care provider. Make sure you discuss any questions you have with your health care provider. Document Revised: 11/22/2019 Document Reviewed: 11/22/2019 Elsevier Patient Education  2022 [employer]  

## 2021-08-20 ENCOUNTER — Other Ambulatory Visit: Payer: Self-pay

## 2021-08-20 ENCOUNTER — Encounter: Payer: Self-pay | Admitting: Family Medicine

## 2021-08-20 ENCOUNTER — Ambulatory Visit (INDEPENDENT_AMBULATORY_CARE_PROVIDER_SITE_OTHER): Payer: BC Managed Care – PPO | Admitting: Family Medicine

## 2021-08-20 VITALS — BP 133/83 | HR 92 | Temp 97.6°F | Ht 70.0 in | Wt 270.0 lb

## 2021-08-20 DIAGNOSIS — Z0000 Encounter for general adult medical examination without abnormal findings: Secondary | ICD-10-CM | POA: Insufficient documentation

## 2021-08-20 DIAGNOSIS — Z13220 Encounter for screening for lipoid disorders: Secondary | ICD-10-CM

## 2021-08-20 DIAGNOSIS — E039 Hypothyroidism, unspecified: Secondary | ICD-10-CM

## 2021-08-20 DIAGNOSIS — Z1211 Encounter for screening for malignant neoplasm of colon: Secondary | ICD-10-CM

## 2021-08-20 DIAGNOSIS — G609 Hereditary and idiopathic neuropathy, unspecified: Secondary | ICD-10-CM | POA: Diagnosis not present

## 2021-08-20 DIAGNOSIS — R41840 Attention and concentration deficit: Secondary | ICD-10-CM

## 2021-08-20 LAB — COMPLETE METABOLIC PANEL WITH GFR
AG Ratio: 2 (calc) (ref 1.0–2.5)
ALT: 24 U/L (ref 9–46)
AST: 27 U/L (ref 10–40)
Albumin: 4.7 g/dL (ref 3.6–5.1)
Alkaline phosphatase (APISO): 32 U/L — ABNORMAL LOW (ref 36–130)
BUN: 16 mg/dL (ref 7–25)
CO2: 28 mmol/L (ref 20–32)
Calcium: 10.1 mg/dL (ref 8.6–10.3)
Chloride: 104 mmol/L (ref 98–110)
Creat: 0.99 mg/dL (ref 0.60–1.29)
Globulin: 2.3 g/dL (calc) (ref 1.9–3.7)
Glucose, Bld: 90 mg/dL (ref 65–99)
Potassium: 4.1 mmol/L (ref 3.5–5.3)
Sodium: 139 mmol/L (ref 135–146)
Total Bilirubin: 0.6 mg/dL (ref 0.2–1.2)
Total Protein: 7 g/dL (ref 6.1–8.1)
eGFR: 96 mL/min/{1.73_m2} (ref >=60)

## 2021-08-20 LAB — CBC WITH DIFFERENTIAL/PLATELET
Absolute Monocytes: 478 cells/uL (ref 200–950)
Basophils Absolute: 47 cells/uL (ref 0–200)
Basophils Relative: 0.8 %
Eosinophils Absolute: 212 cells/uL (ref 15–500)
Eosinophils Relative: 3.6 %
HCT: 50.6 % — ABNORMAL HIGH (ref 38.5–50.0)
Hemoglobin: 17.2 g/dL — ABNORMAL HIGH (ref 13.2–17.1)
Lymphs Abs: 1552 cells/uL (ref 850–3900)
MCH: 29.9 pg (ref 27.0–33.0)
MCHC: 34 g/dL (ref 32.0–36.0)
MCV: 87.8 fL (ref 80.0–100.0)
MPV: 10 fL (ref 7.5–12.5)
Monocytes Relative: 8.1 %
Neutro Abs: 3611 cells/uL (ref 1500–7800)
Neutrophils Relative %: 61.2 %
Platelets: 249 10*3/uL (ref 140–400)
RBC: 5.76 10*6/uL (ref 4.20–5.80)
RDW: 13.1 % (ref 11.0–15.0)
Total Lymphocyte: 26.3 %
WBC: 5.9 10*3/uL (ref 3.8–10.8)

## 2021-08-20 LAB — LIPID PANEL W/REFLEX DIRECT LDL
Cholesterol: 170 mg/dL (ref <200)
HDL: 56 mg/dL (ref >=40)
LDL Cholesterol (Calc): 99 mg/dL (calc)
Non-HDL Cholesterol (Calc): 114 mg/dL (calc) (ref <130)
Total CHOL/HDL Ratio: 3 (calc) (ref <5.0)
Triglycerides: 67 mg/dL (ref <150)

## 2021-08-20 LAB — T4, FREE: Free T4: 1.5 ng/dL (ref 0.8–1.8)

## 2021-08-20 LAB — VITAMIN B12: Vitamin B-12: 565 pg/mL (ref 200–1100)

## 2021-08-20 LAB — TSH: TSH: 1.5 mIU/L (ref 0.40–4.50)

## 2021-08-20 LAB — VITAMIN D 25 HYDROXY (VIT D DEFICIENCY, FRACTURES): Vit D, 25-Hydroxy: 38 ng/mL (ref 30–100)

## 2021-08-20 MED ORDER — LISDEXAMFETAMINE DIMESYLATE 40 MG PO CAPS: 40.0000 mg | ORAL_CAPSULE | ORAL | 0 refills | Status: DC

## 2021-08-20 NOTE — Assessment & Plan Note (Signed)
Well adult ?Orders Placed This Encounter  ?Procedures  ?? COMPLETE METABOLIC PANEL WITH GFR  ?? CBC with Differential  ?? Lipid Panel w/reflex Direct LDL  ?? TSH  ?? Vitamin D (25 hydroxy)  ?? T4, free  ?? B12  ?? Ambulatory referral to Gastroenterology  ?  Referral Priority:   Routine  ?  Referral Type:   Consultation  ?  Referral Reason:   Specialty Services Required  ?  Number of Visits Requested:   1  ?Screenings: Per lab orders, referral to gastroenterology for colonoscopy. ?Immunizations: Up-to-date ?Anticipatory guidance/risk factor reduction: Recommendations per AVS. ?

## 2021-08-20 NOTE — Progress Notes (Signed)
?CORNELIOUS Watson - 46 y.o. male MRN [id number]  Date of birth: 01/17/1976 ? ?Subjective ?Chief Complaint  ?Patient presents with  ? Annual Exam  ? ? ?HPI ?Garrett Watson is a 46 year old male here today for annual exam.  Doing well at this time.  He does feel that addition of Vyvanse has been helpful however he does feel like there needs to be a an increase in dosage. ? ?He does report that he exercises regularly.  He feels like his diet has been pretty healthy. ? ?He does use CBD and delta 8 products.  He does smoke cigarettes occasionally.  He consumes alcohol occasionally. ? ?He has never had colon cancer screening. ? ?Review of Systems  ?Constitutional:  Negative for chills, fever, malaise/fatigue and weight loss.  ?HENT:  Negative for congestion, ear pain and sore throat.   ?Eyes:  Negative for blurred vision, double vision and pain.  ?Respiratory:  Negative for cough and shortness of breath.   ?Cardiovascular:  Negative for chest pain and palpitations.  ?Gastrointestinal:  Negative for abdominal pain, blood in stool, constipation, heartburn and nausea.  ?Genitourinary:  Negative for dysuria and urgency.  ?Musculoskeletal:  Negative for joint pain and myalgias.  ?Neurological:  Negative for dizziness and headaches.  ?Endo/Heme/Allergies:  Does not bruise/bleed easily.  ?Psychiatric/Behavioral:  Negative for depression. The patient is not nervous/anxious and does not have insomnia.   ? ?Allergies  ?Allergen Reactions  ? Other Cough  ?  Patient reports reaction was when he was a baby   ? Robitussin 12 Hour Cough [Dextromethorphan Polistirex Er] Hives  ? Phentermine Other (See Comments)  ?  Irritability, increased heart rate  ? ? ?Past Medical History:  ?Diagnosis Date  ? Allergy   ? Anxiety   ? Arthritis   ? osteoarthritis - left elbow  ? Cervical stenosis of spine 2021  ? Erectile dysfunction   ? Former cigarette smoker   ? Hypogonadism male   ? Hypokalemia   ? Neuromuscular disorder ([place])   ? nerapathy  ? Obesity    ? Tendonitis   ? Thyroid disease   ? ? ?Past Surgical History:  ?Procedure Laterality Date  ? CERVICAL FUSION    ? C5- C6 Feb 8. 2022  ? KNEE ARTHROSCOPY Right   ? 2002, 2018  ? OSTEOTOMY MAXILLARY    ? WISDOM TOOTH EXTRACTION    ? x4  ? ? ?Social History  ? ?Socioeconomic History  ? Marital status: Single  ?  Spouse name: Not on file  ? Number of children: Not on file  ? Years of education: Not on file  ? Highest education level: Not on file  ?Occupational History  ? Not on file  ?Tobacco Use  ? Smoking status: Former  ?  Types: Cigarettes  ?  Quit date: 08/12/2015  ?  Years since quitting: 6.0  ? Smokeless tobacco: Never  ?Vaping Use  ? Vaping Use: Former  ?Substance and Sexual Activity  ? Alcohol use: Yes  ?  Comment: rarely  ? Drug use: Not Currently  ?  Types: Marijuana  ?  Comment: Uses CBD  ? Sexual activity: Not Currently  ?Other Topics Concern  ? Not on file  ?Social History Narrative  ? Not on file  ? ?Social Determinants of Health  ? ?Financial Resource Strain: Not on file  ?Food Insecurity: Not on file  ?Transportation Needs: Not on file  ?Physical Activity: Not on file  ?Stress: Not on file  ?Social Connections: Not  on file  ? ? ?Family History  ?Problem Relation Age of Onset  ? Alcohol abuse Father   ? Hyperlipidemia Father   ? Hypertension Father   ? Stroke Father   ? Alcohol abuse Brother   ? Stroke Paternal Aunt   ? Diabetes Paternal Aunt   ? Diabetes Paternal Uncle   ? Kidney cancer Maternal Grandmother   ? Lung cancer Maternal Grandfather   ? Alcohol abuse Brother   ? Diabetes Cousin   ? Depression Cousin   ? Colon polyps Mother   ? Colon cancer Neg Hx   ? Stomach cancer Neg Hx   ? Esophageal cancer Neg Hx   ? Rectal cancer Neg Hx   ? ? ?Health Maintenance  ?Topic Date Due  ? HIV Screening  Never done  ? COVID-19 Vaccine (3 - Booster for Moderna series) 01/28/2020  ? COLONOSCOPY (Pts 45-58yrs Insurance coverage will need to be confirmed)  Never done  ? TETANUS/TDAP  12/05/2025  ? INFLUENZA VACCINE   Completed  ? Hepatitis C Screening  Completed  ? HPV VACCINES  Aged Out  ? ? ? ?----------------------------------------------------------------------------------------------------------------------------------------------------------------------------------------------------------------- ?Physical Exam ?BP 133/83   Pulse 92   Temp 97.6 ?F (36.4 ?C)   Ht 5\' 10"  (1.778 m)   Wt 270 lb (122.5 kg)   SpO2 98%   BMI 38.74 kg/m?  ? ?Physical Exam ?Constitutional:   ?   General: He is not in acute distress. ?HENT:  ?   Head: Normocephalic and atraumatic.  ?   Right Ear: Tympanic membrane and external ear normal.  ?   Left Ear: Tympanic membrane and external ear normal.  ?Eyes:  ?   General: No scleral icterus. ?Neck:  ?   Thyroid: No thyromegaly.  ?Cardiovascular:  ?   Rate and Rhythm: Normal rate and regular rhythm.  ?   Heart sounds: Normal heart sounds.  ?Pulmonary:  ?   Effort: Pulmonary effort is normal.  ?   Breath sounds: Normal breath sounds.  ?Abdominal:  ?   General: Bowel sounds are normal. There is no distension.  ?   Palpations: Abdomen is soft.  ?   Tenderness: There is no abdominal tenderness. There is no guarding.  ?Musculoskeletal:  ?   Cervical back: Normal range of motion.  ?Lymphadenopathy:  ?   Cervical: No cervical adenopathy.  ?Skin: ?   General: Skin is warm and dry.  ?   Findings: No rash.  ?Neurological:  ?   Mental Status: He is alert and oriented to person, place, and time.  ?   Cranial Nerves: No cranial nerve deficit.  ?   Motor: No abnormal muscle tone.  ?Psychiatric:     ?   Mood and Affect: Mood normal.     ?   Behavior: Behavior normal.  ? ? ?------------------------------------------------------------------------------------------------------------------------------------------------------------------------------------------------------------------- ?Assessment and Plan ? ?Hypothyroidism ?Updating TSH today. ? ?Attention and concentration deficit ?Increasing Vyvanse to 40 mg  daily. ? ?Well adult exam ?Well adult ?Orders Placed This Encounter  ?Procedures  ? COMPLETE METABOLIC PANEL WITH GFR  ? CBC with Differential  ? Lipid Panel w/reflex Direct LDL  ? TSH  ? Vitamin D (25 hydroxy)  ? T4, free  ? B12  ? Ambulatory referral to Gastroenterology  ?  Referral Priority:   Routine  ?  Referral Type:   Consultation  ?  Referral Reason:   Specialty Services Required  ?  Number of Visits Requested:   1  ?Screenings: Per lab orders,  referral to gastroenterology for colonoscopy. ?Immunizations: Up-to-date ?Anticipatory guidance/risk factor reduction: Recommendations per AVS. ? ? ?Meds ordered this encounter  ?Medications  ? lisdexamfetamine (VYVANSE) 40 MG capsule  ?  Sig: Take 1 capsule (40 mg total) by mouth every morning.  ?  Dispense:  30 capsule  ?  Refill:  0  ? ? ?No follow-ups on file. ? ? ? ?This visit occurred during the SARS-CoV-2 public health emergency.  Safety protocols were in place, including screening questions prior to the visit, additional usage of staff PPE, and extensive cleaning of exam room while observing appropriate contact time as indicated for disinfecting solutions.  ? ?

## 2021-08-20 NOTE — Assessment & Plan Note (Signed)
Increasing Vyvanse to 40 mg daily. ?

## 2021-08-20 NOTE — Patient Instructions (Signed)

## 2021-08-20 NOTE — Assessment & Plan Note (Signed)
Updating TSH today. 

## 2021-09-02 ENCOUNTER — Other Ambulatory Visit: Payer: Self-pay | Admitting: Family Medicine

## 2021-09-02 ENCOUNTER — Encounter: Payer: Self-pay | Admitting: Family Medicine

## 2021-09-30 ENCOUNTER — Ambulatory Visit (AMBULATORY_SURGERY_CENTER): Payer: BC Managed Care – PPO | Admitting: *Deleted

## 2021-09-30 ENCOUNTER — Other Ambulatory Visit: Payer: Self-pay

## 2021-09-30 VITALS — Ht 70.0 in | Wt 262.0 lb

## 2021-09-30 DIAGNOSIS — Z1211 Encounter for screening for malignant neoplasm of colon: Secondary | ICD-10-CM

## 2021-09-30 MED ORDER — NA SULFATE-K SULFATE-MG SULF 17.5-3.13-1.6 GM/177ML PO SOLN: 1.0000 | Freq: Once | ORAL | 0 refills | Status: AC

## 2021-09-30 NOTE — Progress Notes (Signed)

## 2021-10-01 ENCOUNTER — Other Ambulatory Visit: Payer: Self-pay

## 2021-10-01 MED ORDER — AMPHETAMINE-DEXTROAMPHET ER 15 MG PO CP24: 15.0000 mg | ORAL_CAPSULE | ORAL | 0 refills | Status: DC

## 2021-10-01 NOTE — Addendum Note (Signed)
Addended by: Perlie Mayo on: 10/01/2021 03:03 PM ? ? Modules accepted: Orders ? ?

## 2021-10-01 NOTE — Addendum Note (Signed)
Addended by: Narda Rutherford on: 10/01/2021 09:34 AM ? ? Modules accepted: Orders ? ?

## 2021-10-01 NOTE — Telephone Encounter (Signed)
Rx sent 

## 2021-10-06 ENCOUNTER — Encounter: Payer: Self-pay | Admitting: Gastroenterology

## 2021-10-14 ENCOUNTER — Encounter: Payer: Self-pay | Admitting: Gastroenterology

## 2021-10-14 ENCOUNTER — Ambulatory Visit (AMBULATORY_SURGERY_CENTER): Payer: BC Managed Care – PPO | Admitting: Gastroenterology

## 2021-10-14 VITALS — BP 112/67 | HR 66 | Temp 98.3°F | Resp 13 | Ht 70.0 in | Wt 262.0 lb

## 2021-10-14 DIAGNOSIS — K5730 Diverticulosis of large intestine without perforation or abscess without bleeding: Secondary | ICD-10-CM

## 2021-10-14 DIAGNOSIS — R197 Diarrhea, unspecified: Secondary | ICD-10-CM | POA: Diagnosis present

## 2021-10-14 DIAGNOSIS — R194 Change in bowel habit: Secondary | ICD-10-CM

## 2021-10-14 DIAGNOSIS — K921 Melena: Secondary | ICD-10-CM

## 2021-10-14 DIAGNOSIS — K640 First degree hemorrhoids: Secondary | ICD-10-CM

## 2021-10-14 DIAGNOSIS — D124 Benign neoplasm of descending colon: Secondary | ICD-10-CM | POA: Diagnosis not present

## 2021-10-14 DIAGNOSIS — R109 Unspecified abdominal pain: Secondary | ICD-10-CM

## 2021-10-14 DIAGNOSIS — Z1211 Encounter for screening for malignant neoplasm of colon: Secondary | ICD-10-CM

## 2021-10-14 MED ORDER — SODIUM CHLORIDE 0.9 % IV SOLN: 500.0000 mL | INTRAVENOUS | Status: DC

## 2021-10-14 NOTE — Progress Notes (Signed)
Called to room to assist during endoscopic procedure.  Patient ID and intended procedure confirmed with present staff. Received instructions for my participation in the procedure from the performing physician.  

## 2021-10-14 NOTE — Progress Notes (Signed)
To Pacu, VSS. Report to [email or web address] 

## 2021-10-14 NOTE — Patient Instructions (Signed)
YOU HAD AN ENDOSCOPIC PROCEDURE TODAY AT THE [facility]:   Refer to the procedure report that was given to you for any specific questions about what was found during the examination.  If the procedure report does not answer your questions, please call your gastroenterologist to clarify.  If you requested that your care partner not be given the details of your procedure findings, then the procedure report has been included in a sealed envelope for you to review at your convenience later. ° °**Handouts given on polyps, diverticulosis and hemorrhoids** ° °YOU SHOULD EXPECT: Some feelings of bloating in the abdomen. Passage of more gas than usual.  Walking can help get rid of the air that was put into your GI tract during the procedure and reduce the bloating. If you had a lower endoscopy (such as a colonoscopy or flexible sigmoidoscopy) you may notice spotting of blood in your stool or on the toilet paper. If you underwent a bowel prep for your procedure, you may not have a normal bowel movement for a few days. ° °Please Note:  You might notice some irritation and congestion in your nose or some drainage.  This is from the oxygen used during your procedure.  There is no need for concern and it should clear up in a day or so. ° °SYMPTOMS TO REPORT IMMEDIATELY: ° °Following lower endoscopy (colonoscopy or flexible sigmoidoscopy): ° Excessive amounts of blood in the stool ° Significant tenderness or worsening of abdominal pains ° Swelling of the abdomen that is new, acute ° Fever of 100°F or higher ° °For urgent or emergent issues, a gastroenterologist can be reached at any hour by calling [phone]. °Do not use MyChart messaging for urgent concerns.  ° ° °DIET:  We do recommend a small meal at first, but then you may proceed to your regular diet.  Drink plenty of fluids but you should avoid alcoholic beverages for 24 hours. ° °ACTIVITY:  You should plan to take it easy for the rest of today and you  should NOT DRIVE or use heavy machinery until tomorrow (because of the sedation medicines used during the test).   ° °FOLLOW UP: °Our staff will call the number listed on your records 48-72 hours following your procedure to check on you and address any questions or concerns that you may have regarding the information given to you following your procedure. If we do not reach you, we will leave a message.  We will attempt to reach you two times.  During this call, we will ask if you have developed any symptoms of COVID 19. If you develop any symptoms (ie: fever, flu-like symptoms, shortness of breath, cough etc.) before then, please call [phone].  If you test positive for Covid 19 in the 2 weeks post procedure, please call and report this information to us.   ° °If any biopsies were taken you will be contacted by phone or by letter within the next 1-3 weeks.  Please call us at [phone] if you have not heard about the biopsies in 3 weeks.  ° ° °SIGNATURES/CONFIDENTIALITY: °You and/or your care partner have signed paperwork which will be entered into your electronic medical record.  These signatures attest to the fact that that the information above on your After Visit Summary has been reviewed and is understood.  Full responsibility of the confidentiality of this discharge information lies with you and/or your care-partner.  °

## 2021-10-14 NOTE — Progress Notes (Signed)
? ?GASTROENTEROLOGY PROCEDURE H&P NOTE  ? ?Primary Care Physician: ?Luetta Nutting, DO ? ? ? ?Reason for Procedure:  Colon Cancer screening ? ?Plan:    Colonoscopy ? ?Patient is appropriate for endoscopic procedure(s) in the ambulatory ([place]) setting. ? ?The nature of the procedure, as well as the risks, benefits, and alternatives were carefully and thoroughly reviewed with the patient. Ample time for discussion and questions allowed. The patient understood, was satisfied, and agreed to proceed.  ? ? ? ?HPI: ?Garrett Watson is a 46 y.o. male who presents for colonoscopy for routine Colon Cancer screening.  Fhx n/f mother with colon polyps, otherwise, no known family history of colon cancer or related malignancy.   ? ?Incidentally, has also been having loose stools, abdominal cramping and intermittent BRBPR. Some imrpovement with dicyclomine. Imodium less efficacious over time.  ? ?Past Medical History:  ?Diagnosis Date  ? Allergy   ? Anxiety   ? Arthritis   ? osteoarthritis - left elbow  ? Asthma   ? Cervical stenosis of spine 2021  ? Erectile dysfunction   ? Former cigarette smoker   ? GERD (gastroesophageal reflux disease)   ? occassionally  ? Hypogonadism male   ? Hypokalemia   ? Neuromuscular disorder ([place])   ? nerapathy  ? Obesity   ? Tendonitis   ? Thyroid disease   ? ? ?Past Surgical History:  ?Procedure Laterality Date  ? CERVICAL FUSION    ? C5- C6 Feb 8. 2022  ? KNEE ARTHROSCOPY Right   ? 2002, 2018  ? left knee Left   ? aug,2022  ? OSTEOTOMY MAXILLARY    ? 1995  ? REPLACEMENT TOTAL KNEE Right   ? nov 2022  ? WISDOM TOOTH EXTRACTION    ? x4  ? ? ?Prior to Admission medications   ?Medication Sig Start Date End Date Taking? Authorizing Provider  ?aspirin 325 MG EC tablet daily. 05/07/21  Yes [provider]  ?dicyclomine (BENTYL) 20 MG tablet Take 1 tablet three to four times daily 07/04/21  Yes Terrilyn Saver, NP  ?fluticasone (FLONASE) 50 MCG/ACT nasal spray Place 1 spray into both  nostrils daily. 04/27/19  Yes Emeterio Reeve, DO  ?Levocetirizine Dihydrochloride (XYZAL PO) Take by mouth.   Yes [provider]  ?levothyroxine (SYNTHROID) 25 MCG tablet TAKE 1 TABLET BY MOUTH EVERY DAY BEFORE BREAKFAST. 07/04/21  Yes Terrilyn Saver, NP  ?montelukast (SINGULAIR) 10 MG tablet TAKE 1 TABLET BY MOUTH EVERYDAY AT BEDTIME. 07/04/21  Yes Terrilyn Saver, NP  ?pregabalin (LYRICA) 75 MG capsule Take 1 capsule (75 mg total) by mouth 3 (three) times daily. ?Patient taking differently: Take 75 mg by mouth 2 (two) times daily. 07/04/21  Yes Terrilyn Saver, NP  ?SYMBICORT 160-4.5 MCG/ACT inhaler TAKE 2 PUFFS BY MOUTH TWICE A DAY 11/25/20  Yes Emeterio Reeve, DO  ?amphetamine-dextroamphetamine (ADDERALL XR) 15 MG 24 hr capsule Take 1 capsule by mouth every morning. ?Patient not taking: Reported on 10/14/2021 10/01/21   Luetta Nutting, DO  ?Multiple Vitamins-Minerals (CENTRUM ADULTS PO) Take by mouth daily.    [provider]  ?nitroGLYCERIN (NITRODUR - DOSED IN MG/24 HR) 0.2 mg/hr patch Cut and apply 1/4 patch to most painful area q24h. ?Patient not taking: Reported on 10/14/2021 04/30/20   Silverio Decamp, MD  ?NON FORMULARY BCP157(peptide) ?Patient not taking: Reported on 09/30/2021    [provider]  ?NONFORMULARY OR COMPOUNDED ITEM B-NOX Pre-workout formula ?Patient not taking: Reported on 10/14/2021  [provider]  ?OVER THE COUNTER MEDICATION daily. Delta 8 ?Patient not taking: Reported on 10/14/2021    [provider]  ? ? ?Current Outpatient Medications  ?Medication Sig Dispense Refill  ? aspirin 325 MG EC tablet daily.    ? dicyclomine (BENTYL) 20 MG tablet Take 1 tablet three to four times daily 90 tablet 3  ? fluticasone (FLONASE) 50 MCG/ACT nasal spray Place 1 spray into both nostrils daily. 48 g 3  ? Levocetirizine Dihydrochloride (XYZAL PO) Take by mouth.    ? levothyroxine (SYNTHROID) 25 MCG tablet TAKE 1 TABLET BY MOUTH EVERY DAY BEFORE  BREAKFAST. 90 tablet 1  ? montelukast (SINGULAIR) 10 MG tablet TAKE 1 TABLET BY MOUTH EVERYDAY AT BEDTIME. 90 tablet 1  ? pregabalin (LYRICA) 75 MG capsule Take 1 capsule (75 mg total) by mouth 3 (three) times daily. (Patient taking differently: Take 75 mg by mouth 2 (two) times daily.) 90 capsule 1  ? SYMBICORT 160-4.5 MCG/ACT inhaler TAKE 2 PUFFS BY MOUTH TWICE A DAY 30.6 each 3  ? amphetamine-dextroamphetamine (ADDERALL XR) 15 MG 24 hr capsule Take 1 capsule by mouth every morning. (Patient not taking: Reported on 10/14/2021) 30 capsule 0  ? Multiple Vitamins-Minerals (CENTRUM ADULTS PO) Take by mouth daily.    ? nitroGLYCERIN (NITRODUR - DOSED IN MG/24 HR) 0.2 mg/hr patch Cut and apply 1/4 patch to most painful area q24h. (Patient not taking: Reported on 10/14/2021) 30 patch 11  ? NON FORMULARY BCP157(peptide) (Patient not taking: Reported on 09/30/2021)    ? NONFORMULARY OR COMPOUNDED ITEM B-NOX Pre-workout formula (Patient not taking: Reported on 10/14/2021)    ? OVER THE COUNTER MEDICATION daily. Delta 8 (Patient not taking: Reported on 10/14/2021)    ? ?Current Facility-Administered Medications  ?Medication Dose Route Frequency Provider Last Rate Last Admin  ? 0.9 %  sodium chloride infusion  500 mL Intravenous Once Judeen Geralds V, DO      ? 0.9 %  sodium chloride infusion  500 mL Intravenous Continuous Kenadi Miltner V, DO      ? ? ?Allergies as of 10/14/2021 - Review Complete 10/14/2021  ?Allergen Reaction Noted  ? Other Cough 06/26/2019  ? Robitussin 12 hour cough [dextromethorphan polistirex er] Hives 10/01/2017  ? Phentermine Other (See Comments) 08/31/2018  ? ? ?Family History  ?Problem Relation Age of Onset  ? Colon polyps Mother   ? Alcohol abuse Father   ? Hyperlipidemia Father   ? Hypertension Father   ? Stroke Father   ? Alcohol abuse Brother   ? Alcohol abuse Brother   ? Stroke Paternal Aunt   ? Diabetes Paternal Aunt   ? Diabetes Paternal Uncle   ? Kidney cancer Maternal Grandmother   ? Lung  cancer Maternal Grandfather   ? Diabetes Cousin   ? Depression Cousin   ? Colon cancer Neg Hx   ? Stomach cancer Neg Hx   ? Esophageal cancer Neg Hx   ? Rectal cancer Neg Hx   ? Crohn's disease Neg Hx   ? ? ?Social History  ? ?Socioeconomic History  ? Marital status: Single  ?  Spouse name: Not on file  ? Number of children: Not on file  ? Years of education: Not on file  ? Highest education level: Not on file  ?Occupational History  ? Not on file  ?Tobacco Use  ? Smoking status: Former  ?  Types: Cigarettes  ?  Quit date: 08/12/2015  ?  Years since quitting: 6.1  ?  Passive exposure: Current  ? Smokeless tobacco: Never  ?Vaping Use  ? Vaping Use: Every day  ?Substance and Sexual Activity  ? Alcohol use: Yes  ?  Comment: rarely  ? Drug use: Not Currently  ?  Types: Marijuana  ?  Comment: Uses CBD  ? Sexual activity: Not Currently  ?Other Topics Concern  ? Not on file  ?Social History Narrative  ? Not on file  ? ?Social Determinants of Health  ? ?Financial Resource Strain: Not on file  ?Food Insecurity: Not on file  ?Transportation Needs: Not on file  ?Physical Activity: Not on file  ?Stress: Not on file  ?Social Connections: Not on file  ?Intimate Partner Violence: Not on file  ? ? ?Physical Exam: ?Vital signs in last 24 hours: ?'@BP'$  (!) 111/55   Pulse 78   Temp 98.3 ?F (36.8 ?C)   Ht '5\' 10"'$  (1.778 m)   Wt 262 lb (118.8 kg)   SpO2 100%   BMI 37.59 kg/m?  ?GEN: NAD ?EYE: Sclerae anicteric ?ENT: MMM ?CV: Non-tachycardic ?Pulm: CTA b/l ?GI: Soft, NT/ND ?NEURO:  Alert & Oriented x 3 ? ? ?Gerrit Heck, DO ?[facility] ? ? ?10/14/2021 9:20 AM ? ?

## 2021-10-14 NOTE — Op Note (Signed)
[place] ?Patient Name: Garrett Watson ?Procedure Date: 10/14/2021 9:20 AM ?MRN: [id number] ?Endoscopist: Gerrit Heck , MD ?Age: 46 ?Referring MD:  ?Date of Birth: August 08, 1975 ?Gender: Male ?Account #: [id number] ?Procedure:                Colonoscopy ?Indications:              Screening for colorectal malignant neoplasm. No  ?                          previous colonoscopy. ?                          Incidentally, he also has been having diarrhea and  ?                          abdominal cramping along with intermittent  ?                          hematochezia. ?Medicines:                Monitored Anesthesia Care ?Procedure:                Pre-Anesthesia Assessment: ?                          - Prior to the procedure, a History and Physical  ?                          was performed, and patient medications and  ?                          allergies were reviewed. The patient's tolerance of  ?                          previous anesthesia was also reviewed. The risks  ?                          and benefits of the procedure and the sedation  ?                          options and risks were discussed with the patient.  ?                          All questions were answered, and informed consent  ?                          was obtained. Prior Anticoagulants: The patient has  ?                          taken no previous anticoagulant or antiplatelet  ?                          agents. ASA Grade Assessment: II - A patient with  ?  mild systemic disease. After reviewing the risks  ?                          and benefits, the patient was deemed in  ?                          satisfactory condition to undergo the procedure. ?                          After obtaining informed consent, the colonoscope  ?                          was passed under direct vision. Throughout the  ?                          procedure, the patient's blood pressure, pulse, and  ?                           oxygen saturations were monitored continuously. The  ?                          CF [id number] #[id number] was introduced through the anus  ?                          and advanced to the the terminal ileum. The  ?                          colonoscopy was performed without difficulty. The  ?                          patient tolerated the procedure well. The quality  ?                          of the bowel preparation was good. The terminal  ?                          ileum, ileocecal valve, appendiceal orifice, and  ?                          rectum were photographed. ?Scope In: 9:29:04 AM ?Scope Out: 9:43:04 AM ?Scope Withdrawal Time: 0 hours 11 minutes 13 seconds  ?Total Procedure Duration: 0 hours 14 minutes 0 seconds  ?Findings:                 The perianal and digital rectal examinations were  ?                          normal. ?                          A 4 mm polyp was found in the descending colon. The  ?                          polyp was sessile. The polyp was removed with a  ?  cold snare. Resection and retrieval were complete.  ?                          Estimated blood loss was minimal. ?                          A few small-mouthed diverticula were found in the  ?                          sigmoid colon. ?                          Normal mucosa was found in the entire colon.  ?                          Biopsies for histology were taken with a cold  ?                          forceps from the right colon and left colon for  ?                          evaluation of microscopic colitis. Estimated blood  ?                          loss was minimal. ?                          Non-bleeding internal hemorrhoids were found during  ?                          retroflexion. The hemorrhoids were small. ?                          The terminal ileum appeared normal. Advanced the  ?                          colonoscope 10 cm into the ileum. ?Complications:            No immediate  complications. ?Estimated Blood Loss:     Estimated blood loss was minimal. ?Impression:               - One 4 mm polyp in the descending colon, removed  ?                          with a cold snare. Resected and retrieved. ?                          - Diverticulosis in the sigmoid colon. ?                          - Normal mucosa in the entire examined colon.  ?                          Biopsied. ?                          -  Non-bleeding internal hemorrhoids. ?                          - The examined portion of the ileum was normal. ?Recommendation:           - Patient has a contact number available for  ?                          emergencies. The signs and symptoms of potential  ?                          delayed complications were discussed with the  ?                          patient. Return to normal activities tomorrow.  ?                          Written discharge instructions were provided to the  ?                          patient. ?                          - Resume previous diet. ?                          - Continue present medications. ?                          - Await pathology results. ?                          - Repeat colonoscopy for surveillance based on  ?                          pathology results. ?                          - Return to GI office PRN. ?                          - Use fiber, for example Citrucel, Fibercon, Konsyl  ?                          or Metamucil. ?                          - Internal hemorrhoids were noted on this study and  ?                          may be amenable to hemorrhoid band ligation. If you  ?                          are interested in further treatment of these  ?                          hemorrhoids with  band ligation, please contact my  ?                          clinic to set up an appointment for evaluation and  ?                          treatment. ?Gerrit Heck, MD ?10/14/2021 9:48:35 AM ?

## 2021-10-16 ENCOUNTER — Telehealth: Payer: Self-pay

## 2021-10-16 NOTE — Telephone Encounter (Signed)
?  Follow up Call- ? ? ?  10/14/2021  ?  8:53 AM 11/29/2020  ?  2:58 PM  ?Call back number  ?Post procedure Call Back phone  # [phone]  ?Permission to leave phone message Yes Yes  ?  ? ?Patient questions: ? ?Do you have a fever, pain , or abdominal swelling? No. ?Pain Score  0 * ? ?Have you tolerated food without any problems? Yes.   ? ?Have you been able to return to your normal activities? Yes.   ? ?Do you have any questions about your discharge instructions: ?Diet   No. ?Medications  No. ?Follow up visit  No. ? ?Do you have questions or concerns about your Care? No. ? ?Actions: ?* If pain score is 4 or above: ?No action needed, pain <4. ? ? ?

## 2021-10-22 ENCOUNTER — Encounter: Payer: Self-pay | Admitting: Gastroenterology

## 2021-11-17 IMAGING — MR MR KNEE*L* W/O CM
7 series · 40 of 40 positions shown · non-contrast
Comparison: 06/11/2020

CLINICAL DATA: Left knee pain, instability with crepitus for 8
months.

EXAM:
MRI OF THE LEFT KNEE WITHOUT CONTRAST
TECHNIQUE: Multiplanar, multisequence MR imaging of the knee was performed. No
intravenous contrast was administered.

[Series 3: T2 fat-sat · axial · 4.0mm · 0.53mm/px · z∈[-88,+82]mm · 6 of 35 slices shown (1 of 3)]
[im 1/35]
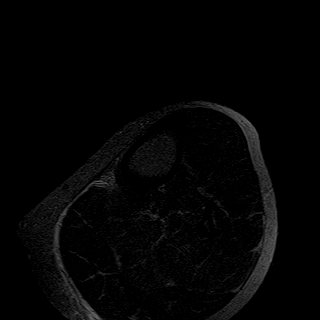
[im 7/35]
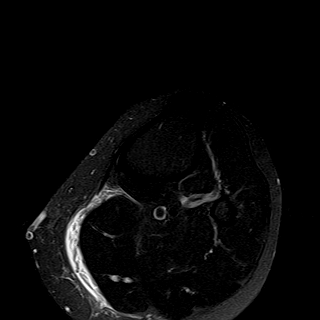
[im 14/35]
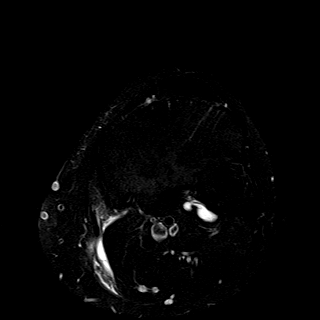
[im 21/35]
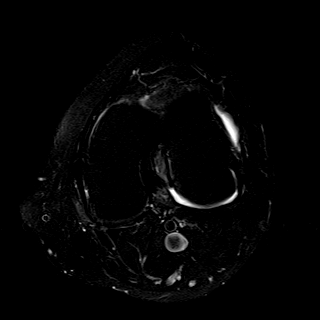
[im 28/35]
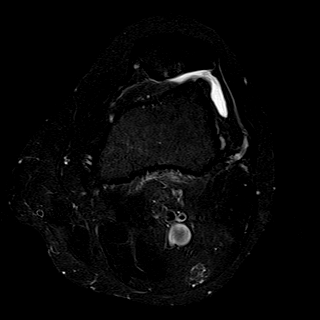
[im 35/35]
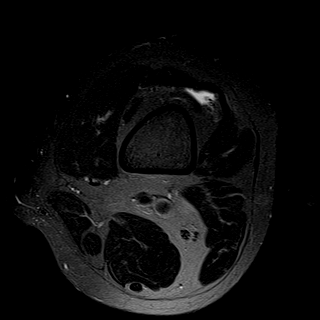

[Series 4: T1 · coronal · 4.0mm · 0.62mm/px · 6 of 31 slices shown]
[im 1/31]
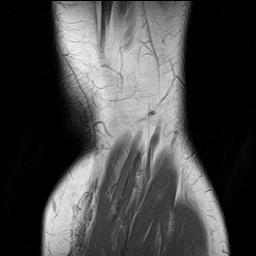
[im 7/31]
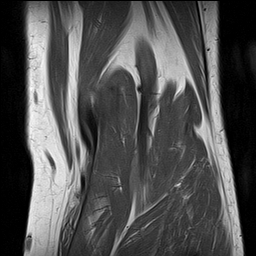
[im 13/31]
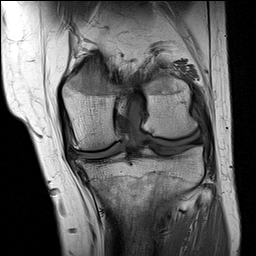
[im 19/31]
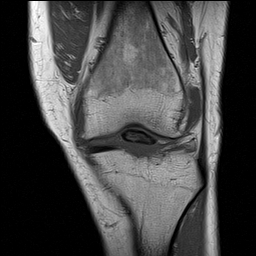
[im 25/31]
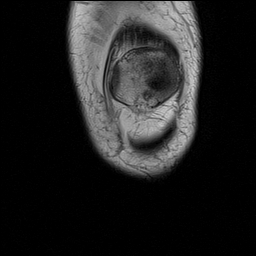
[im 31/31]
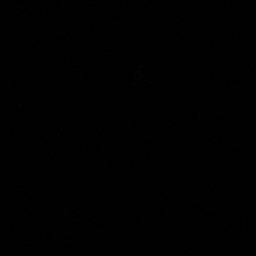

[Series 5: T2 fat-sat · coronal · 4.0mm · 0.62mm/px · 6 of 31 slices shown (2 of 3)]
[im 1/31]
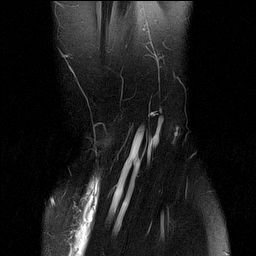
[im 7/31]
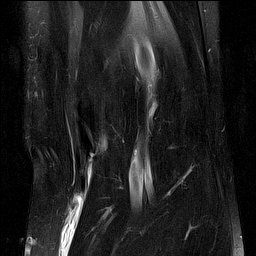
[im 13/31]
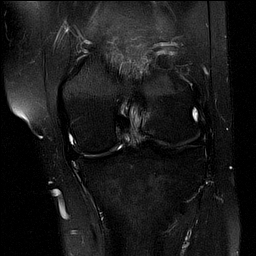
[im 19/31]
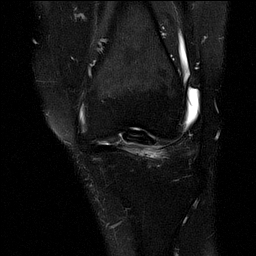
[im 25/31]
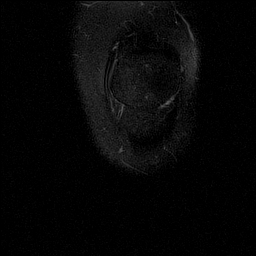
[im 31/31]
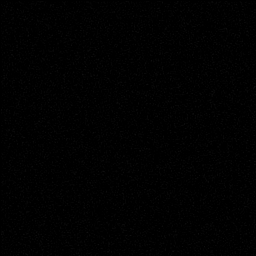

[Series 6: PD fat-sat · coronal · 4.0mm · 0.62mm/px · 6 of 31 slices shown (1 of 3)]
[im 1/31]
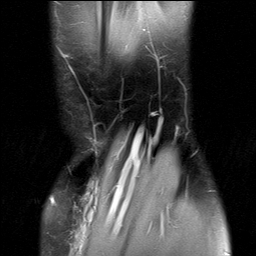
[im 7/31]
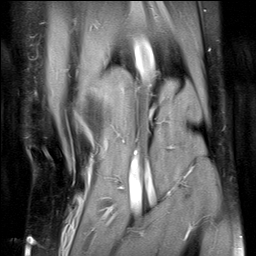
[im 13/31]
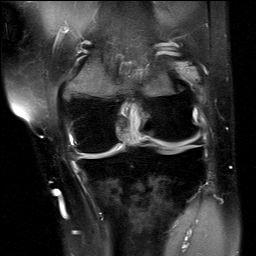
[im 19/31]
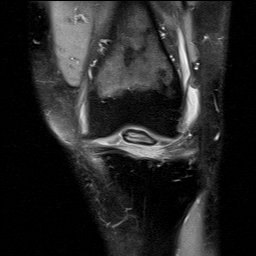
[im 25/31]
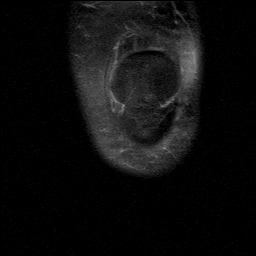
[im 31/31]
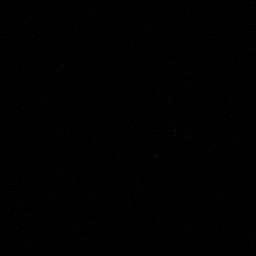

[Series 7: PD fat-sat · sagittal · 3.0mm · 0.62mm/px · 6 of 35 slices shown (2 of 3)]
[im 1/35]
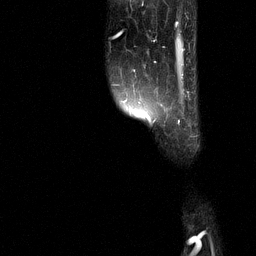
[im 7/35]
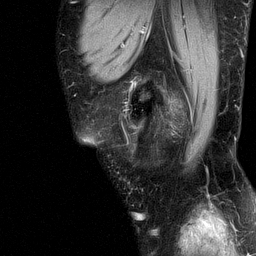
[im 14/35]
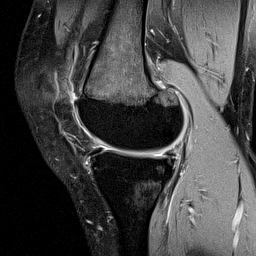
[im 21/35]
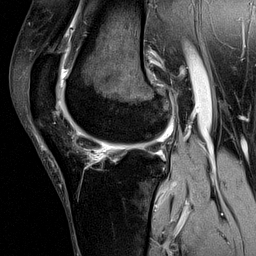
[im 28/35]
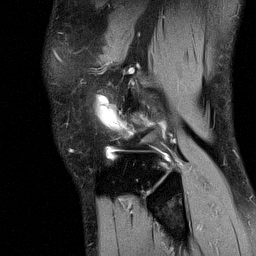
[im 35/35]
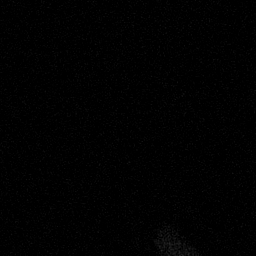

[Series 8: T2 fat-sat · sagittal · 3.0mm · 0.62mm/px · 6 of 35 slices shown (3 of 3)]
[im 1/35]
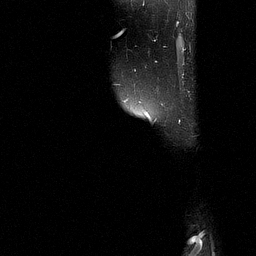
[im 7/35]
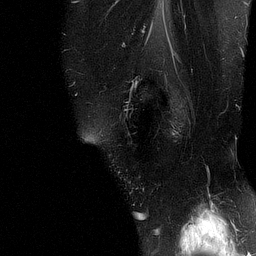
[im 14/35]
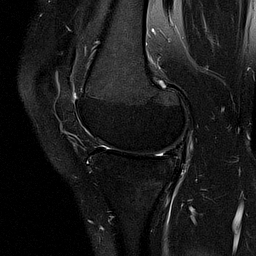
[im 21/35]
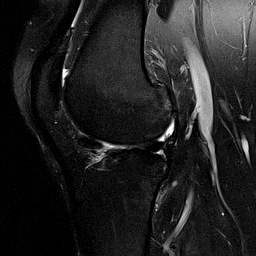
[im 28/35]
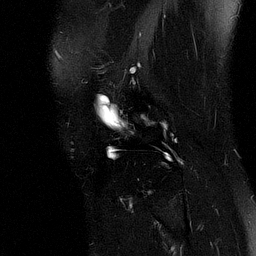
[im 35/35]
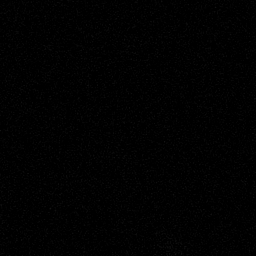

[Series 9: PD fat-sat · coronal · 2.0mm · 0.62mm/px · 4 of 19 slices shown (3 of 3)]
[im 1/19]
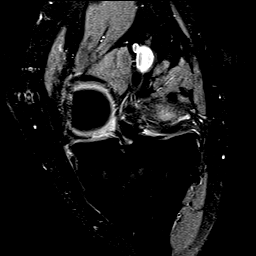
[im 7/19]
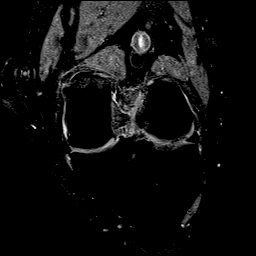
[im 13/19]
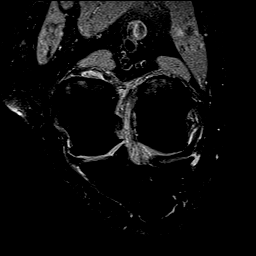
[im 19/19]
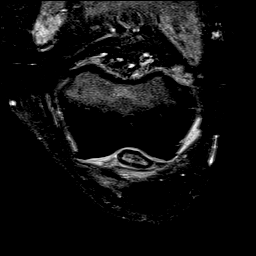

[40 of 40 positions shown; findings below may reference images not displayed]

FINDINGS: MENISCI

Medial meniscus: Mildly complex tear of the posterior horn medial
meniscus with oblique component extending to the inferior meniscal
surface on image 11 of series 7 and also with a small free edge
component on image 12 series 7, as well as amorphous degenerative
signal in the vicinity of the root of the posterior horn which may
reflect extension of tear.

Lateral meniscus:  Unremarkable

LIGAMENTS

Cruciates: Expanded ACL with accentuated signal favoring ACL cyst or
sprain. PCL intact.

Collaterals:  Unremarkable

CARTILAGE

Patellofemoral: Substantial chondral thinning along the lateral
patellar facet with small foci of degenerative subcortical marrow
edema. Mild patellofemoral spurring.

Medial: Moderate degenerative chondral thinning in the medial
compartment with marginal spurring.

Lateral:  Mild chondral thinning with marginal spurring.

Joint:  Small knee joint effusion.  Thickened medial plica.

2.4 by 1.0 by 2.6 cm oval-shaped free osteochondral fragment
interposed between the lower ACL and the posterior portion of
Hoffa's fat pad on image 18 series 7.

Popliteal Fossa: Ruptured Baker's cyst with edema tracking along the
medial head gastrocnemius margin.

Extensor Mechanism:  Unremarkable

Bones: No significant extra-articular osseous abnormalities
identified.

Other: No supplemental non-categorized findings.
IMPRESSION: 1. 2.6 cm in long axis free osteochondral fragment interposed
between the lower ACL and the posterior portion of Hoffa's fat pad.
2. Mildly complex tear posterior horn medial meniscus.
3. Expanded ACL with accentuated signal favoring ACL cyst or sprain.
4. Osteoarthritis with chondral thinning most notably in the medial
compartment and along the lateral patellar facet.
5. Small knee joint effusion with thickened medial plica.
6. Ruptured Baker's cyst.

## 2021-12-03 ENCOUNTER — Other Ambulatory Visit: Payer: Self-pay | Admitting: Family Medicine

## 2021-12-03 DIAGNOSIS — K580 Irritable bowel syndrome with diarrhea: Secondary | ICD-10-CM

## 2021-12-04 ENCOUNTER — Ambulatory Visit: Payer: BC Managed Care – PPO | Admitting: Family Medicine

## 2021-12-10 ENCOUNTER — Ambulatory Visit: Payer: BC Managed Care – PPO | Admitting: Family Medicine

## 2021-12-10 ENCOUNTER — Encounter: Payer: Self-pay | Admitting: Family Medicine

## 2021-12-10 VITALS — BP 122/78 | HR 77 | Ht 70.0 in | Wt 278.1 lb

## 2021-12-10 DIAGNOSIS — K580 Irritable bowel syndrome with diarrhea: Secondary | ICD-10-CM | POA: Diagnosis not present

## 2021-12-10 DIAGNOSIS — G609 Hereditary and idiopathic neuropathy, unspecified: Secondary | ICD-10-CM | POA: Diagnosis not present

## 2021-12-10 DIAGNOSIS — M48061 Spinal stenosis, lumbar region without neurogenic claudication: Secondary | ICD-10-CM

## 2021-12-10 DIAGNOSIS — R41840 Attention and concentration deficit: Secondary | ICD-10-CM | POA: Diagnosis not present

## 2021-12-10 MED ORDER — AMPHETAMINE-DEXTROAMPHET ER 20 MG PO CP24: 20.0000 mg | ORAL_CAPSULE | ORAL | 0 refills | Status: DC

## 2021-12-10 MED ORDER — PREGABALIN 75 MG PO CAPS: 75.0000 mg | ORAL_CAPSULE | Freq: Two times a day (BID) | ORAL | 1 refills | Status: DC

## 2021-12-10 NOTE — Progress Notes (Signed)
Garrett Watson - 46 y.o. male MRN [id number]  Date of birth: 1975/07/15  Subjective Chief Complaint  Patient presents with   GI Problem   ADHD    HPI Garrett Watson is a 46 y.o. male here today for follow up visit.  He had screening colonoscopy.  Wishes to return to GI as he continues to have issues with abdominal pain, bloating and diarrhea.  He has tried elimination diet and tried going vegetarian to increase his fiber intake without any relief.   Feels that current dose of adderall is not quite working as well.  He feels more fidgety and interrupts conversations more. He is tolerating the '15mg'$  dose well at this time.   Needs renewal of lyrica.  This is working pretty well for his history of lumbar spinal stenosis.   ROS:  A comprehensive ROS was completed and negative except as noted per HPI  Allergies  Allergen Reactions   Other Cough    Patient reports reaction was when he was a baby    Robitussin 12 Hour Cough [Dextromethorphan Polistirex Er] Hives   Phentermine Other (See Comments)    Irritability, increased heart rate    Past Medical History:  Diagnosis Date   Allergy    Anxiety    Arthritis    osteoarthritis - left elbow   Asthma    Cervical stenosis of spine 2021   Erectile dysfunction    Former cigarette smoker    GERD (gastroesophageal reflux disease)    occassionally   Hypogonadism male    Hypokalemia    Neuromuscular disorder (HCC)    nerapathy   Obesity    Tendonitis    Thyroid disease     Past Surgical History:  Procedure Laterality Date   CERVICAL FUSION     C5- C6 Feb 8. 2022   KNEE ARTHROSCOPY Right    2002, 2018   left knee Left    aug,2022   OSTEOTOMY MAXILLARY     1995   REPLACEMENT TOTAL KNEE Right    nov 2022   WISDOM TOOTH EXTRACTION     x4    Social History   Socioeconomic History   Marital status: Single    Spouse name: Not on file   Number of children: Not on file   Years of education: Not on file    Highest education level: Not on file  Occupational History   Not on file  Tobacco Use   Smoking status: Former    Types: Cigarettes    Quit date: 08/12/2015    Years since quitting: 6.3    Passive exposure: Current   Smokeless tobacco: Never  Vaping Use   Vaping Use: Every day  Substance and Sexual Activity   Alcohol use: Yes    Comment: rarely   Drug use: Not Currently    Types: Marijuana    Comment: Uses CBD   Sexual activity: Not Currently  Other Topics Concern   Not on file  Social History Narrative   Not on file   Social Determinants of Health   Financial Resource Strain: Not on file  Food Insecurity: Not on file  Transportation Needs: Not on file  Physical Activity: Not on file  Stress: Not on file  Social Connections: Not on file    Family History  Problem Relation Age of Onset   Colon polyps Mother    Alcohol abuse Father    Hyperlipidemia Father    Hypertension Father    Stroke Father  Alcohol abuse Brother    Alcohol abuse Brother    Stroke Paternal Aunt    Diabetes Paternal Aunt    Diabetes Paternal Uncle    Kidney cancer Maternal Grandmother    Lung cancer Maternal Grandfather    Diabetes Cousin    Depression Cousin    Colon cancer Neg Hx    Stomach cancer Neg Hx    Esophageal cancer Neg Hx    Rectal cancer Neg Hx    Crohn's disease Neg Hx     Health Maintenance  Topic Date Due   HIV Screening  Never done   COVID-19 Vaccine (3 - Moderna series) 01/28/2020   INFLUENZA VACCINE  01/20/2022   TETANUS/TDAP  12/05/2025   COLONOSCOPY (Pts 45-57yr Insurance coverage will need to be confirmed)  10/14/2028   Hepatitis C Screening  Completed   HPV VACCINES  Aged Out     ----------------------------------------------------------------------------------------------------------------------------------------------------------------------------------------------------------------- Physical Exam BP 122/78 (BP Location: Left Arm, Patient Position:  Sitting, Cuff Size: Large)   Pulse 77   Ht '5\' 10"'$  (1.778 m)   Wt 278 lb 1.3 oz (126.1 kg)   SpO2 100%   BMI 39.90 kg/m   Physical Exam Constitutional:      Appearance: Normal appearance.  Eyes:     General: No scleral icterus. Cardiovascular:     Rate and Rhythm: Normal rate and regular rhythm.  Pulmonary:     Effort: Pulmonary effort is normal.     Breath sounds: Normal breath sounds.  Musculoskeletal:     Cervical back: Neck supple.  Neurological:     General: No focal deficit present.     Mental Status: He is alert.  Psychiatric:        Mood and Affect: Mood normal.        Behavior: Behavior normal.     ------------------------------------------------------------------------------------------------------------------------------------------------------------------------------------------------------------------- Assessment and Plan  Lumbar spinal stenosis He will continue lyrica at current strength for now.    Irritable bowel syndrome with diarrhea He continues to have diarrhea with bloating and pain. Recent colonoscopy relatively unremarkable.  He would like to return to GI for continued evaluation of his symptoms.  Referral entered.   Attention and concentration deficit Increasing adderall xr to '20mg'$  daily. He will let me know how he is doing with this after about 1 month.     Meds ordered this encounter  Medications   pregabalin (LYRICA) 75 MG capsule    Sig: Take 1 capsule (75 mg total) by mouth 2 (two) times daily.    Dispense:  180 capsule    Refill:  1    Not to exceed 5 additional fills before 09/06/2019   amphetamine-dextroamphetamine (ADDERALL XR) 20 MG 24 hr capsule    Sig: Take 1 capsule (20 mg total) by mouth every morning.    Dispense:  30 capsule    Refill:  0    Return in about 3 months (around 03/12/2022) for ADHD.    This visit occurred during the SARS-CoV-2 public health emergency.  Safety protocols were in place, including screening  questions prior to the visit, additional usage of staff PPE, and extensive cleaning of exam room while observing appropriate contact time as indicated for disinfecting solutions.

## 2021-12-10 NOTE — Assessment & Plan Note (Signed)
He continues to have diarrhea with bloating and pain. Recent colonoscopy relatively unremarkable.  He would like to return to GI for continued evaluation of his symptoms.  Referral entered.

## 2021-12-10 NOTE — Assessment & Plan Note (Signed)
He will continue lyrica at current strength for now.

## 2021-12-10 NOTE — Assessment & Plan Note (Signed)
Increasing adderall xr to '20mg'$  daily. He will let me know how he is doing with this after about 1 month.

## 2021-12-29 ENCOUNTER — Encounter: Payer: Self-pay | Admitting: Family Medicine

## 2021-12-29 MED ORDER — AMPHETAMINE-DEXTROAMPHET ER 30 MG PO CP24: 30.0000 mg | ORAL_CAPSULE | ORAL | 0 refills | Status: DC

## 2021-12-30 ENCOUNTER — Other Ambulatory Visit: Payer: Self-pay

## 2021-12-30 MED ORDER — AMPHETAMINE-DEXTROAMPHET ER 30 MG PO CP24: 30.0000 mg | ORAL_CAPSULE | ORAL | 0 refills | Status: DC

## 2021-12-30 MED ORDER — AMPHETAMINE-DEXTROAMPHET ER 30 MG PO CP24: 30.0000 mg | ORAL_CAPSULE | Freq: Every day | ORAL | 0 refills | Status: DC

## 2022-01-20 ENCOUNTER — Other Ambulatory Visit: Payer: Self-pay | Admitting: Osteopathic Medicine

## 2022-01-20 DIAGNOSIS — R058 Other specified cough: Secondary | ICD-10-CM

## 2022-02-03 ENCOUNTER — Encounter: Payer: Self-pay | Admitting: Family Medicine

## 2022-02-05 ENCOUNTER — Encounter: Payer: Self-pay | Admitting: Family Medicine

## 2022-02-05 DIAGNOSIS — J3089 Other allergic rhinitis: Secondary | ICD-10-CM

## 2022-02-05 DIAGNOSIS — E038 Other specified hypothyroidism: Secondary | ICD-10-CM

## 2022-02-05 MED ORDER — MONTELUKAST SODIUM 10 MG PO TABS
ORAL_TABLET | ORAL | 1 refills | Status: DC
Start: 2022-02-05 — End: 2022-08-05

## 2022-02-05 MED ORDER — LEVOTHYROXINE SODIUM 25 MCG PO TABS: ORAL_TABLET | ORAL | 1 refills | Status: DC

## 2022-02-05 NOTE — Telephone Encounter (Signed)
Completed.    Thanks

## 2022-03-12 ENCOUNTER — Ambulatory Visit: Payer: BC Managed Care – PPO | Admitting: Family Medicine

## 2022-03-12 ENCOUNTER — Encounter: Payer: Self-pay | Admitting: Family Medicine

## 2022-03-12 VITALS — BP 118/76 | HR 78 | Ht 70.0 in | Wt 282.0 lb

## 2022-03-12 DIAGNOSIS — Z23 Encounter for immunization: Secondary | ICD-10-CM | POA: Diagnosis not present

## 2022-03-12 DIAGNOSIS — R41840 Attention and concentration deficit: Secondary | ICD-10-CM | POA: Diagnosis not present

## 2022-03-12 MED ORDER — METHYLPHENIDATE HCL ER (CD) 30 MG PO CPCR: 30.0000 mg | ORAL_CAPSULE | ORAL | 0 refills | Status: DC

## 2022-03-12 NOTE — Assessment & Plan Note (Signed)
He had side effects with amphetamine salt derivatives.  Trying methylphenidate to see if this is better tolerated.  F/u in 3 months.

## 2022-03-12 NOTE — Progress Notes (Signed)
Garrett Watson - 46 y.o. male MRN [id number]  Date of birth: 02-19-1976  Subjective Chief Complaint  Patient presents with   ADHD    HPI Garrett Watson is a 46 y.o. male here today for follow up visit.    Started on adderall xr previously for ADHD.  Did not have good response to lower strength, however when strength was increased he noticed that he was more fidgety and tongue was fidgety. The increased strength did control ADD symptoms a little better. He did not have any other notable side effects including increased insomnia, palpitations or appetite change.    ROS:  A comprehensive ROS was completed and negative except as noted per HPI  Allergies  Allergen Reactions   Other Cough    Patient reports reaction was when he was a baby    Robitussin 12 Hour Cough [Dextromethorphan Polistirex Er] Hives   Phentermine Other (See Comments)    Irritability, increased heart rate    Past Medical History:  Diagnosis Date   Allergy    Anxiety    Arthritis    osteoarthritis - left elbow   Asthma    Cervical stenosis of spine 2021   Erectile dysfunction    Former cigarette smoker    GERD (gastroesophageal reflux disease)    occassionally   Hypogonadism male    Hypokalemia    Neuromuscular disorder (HCC)    nerapathy   Obesity    Tendonitis    Thyroid disease     Past Surgical History:  Procedure Laterality Date   CERVICAL FUSION     C5- C6 Feb 8. 2022   KNEE ARTHROSCOPY Right    2002, 2018   left knee Left    aug,2022   OSTEOTOMY MAXILLARY     1995   REPLACEMENT TOTAL KNEE Right    nov 2022   WISDOM TOOTH EXTRACTION     x4    Social History   Socioeconomic History   Marital status: Single    Spouse name: Not on file   Number of children: Not on file   Years of education: Not on file   Highest education level: Not on file  Occupational History   Not on file  Tobacco Use   Smoking status: Former    Types: Cigarettes    Quit date: 08/12/2015     Years since quitting: 6.5    Passive exposure: Current   Smokeless tobacco: Never  Vaping Use   Vaping Use: Every day  Substance and Sexual Activity   Alcohol use: Yes    Comment: rarely   Drug use: Not Currently    Types: Marijuana    Comment: Uses CBD   Sexual activity: Not Currently  Other Topics Concern   Not on file  Social History Narrative   Not on file   Social Determinants of Health   Financial Resource Strain: Not on file  Food Insecurity: Not on file  Transportation Needs: Not on file  Physical Activity: Not on file  Stress: Not on file  Social Connections: Not on file    Family History  Problem Relation Age of Onset   Colon polyps Mother    Alcohol abuse Father    Hyperlipidemia Father    Hypertension Father    Stroke Father    Alcohol abuse Brother    Alcohol abuse Brother    Stroke Paternal Aunt    Diabetes Paternal Aunt    Diabetes Paternal Uncle    Kidney cancer  Maternal Grandmother    Lung cancer Maternal Grandfather    Diabetes Cousin    Depression Cousin    Colon cancer Neg Hx    Stomach cancer Neg Hx    Esophageal cancer Neg Hx    Rectal cancer Neg Hx    Crohn's disease Neg Hx     Health Maintenance  Topic Date Due   INFLUENZA VACCINE  01/20/2022   COVID-19 Vaccine (3 - Moderna series) 03/28/2022 (Originally 01/28/2020)   HIV Screening  03/13/2023 (Originally 08/12/1991)   TETANUS/TDAP  12/05/2025   COLONOSCOPY (Pts 45-56yr Insurance coverage will need to be confirmed)  10/14/2028   Hepatitis C Screening  Completed   HPV VACCINES  Aged Out     ----------------------------------------------------------------------------------------------------------------------------------------------------------------------------------------------------------------- Physical Exam BP 118/76 (BP Location: Left Arm, Patient Position: Sitting, Cuff Size: Large)   Pulse 78   Ht '5\' 10"'$  (1.778 m)   Wt 282 lb (127.9 kg)   SpO2 100%   BMI 40.46 kg/m    Physical Exam Constitutional:      Appearance: Normal appearance.  Eyes:     General: No scleral icterus. Cardiovascular:     Rate and Rhythm: Normal rate and regular rhythm.  Pulmonary:     Effort: Pulmonary effort is normal.     Breath sounds: Normal breath sounds.  Musculoskeletal:     Cervical back: Neck supple.  Neurological:     General: No focal deficit present.     Mental Status: He is alert.  Psychiatric:        Mood and Affect: Mood normal.        Behavior: Behavior normal.     ------------------------------------------------------------------------------------------------------------------------------------------------------------------------------------------------------------------- Assessment and Plan  Attention and concentration deficit He had side effects with amphetamine salt derivatives.  Trying methylphenidate to see if this is better tolerated.  F/u in 3 months.    Meds ordered this encounter  Medications   methylphenidate (METADATE CD) 30 MG CR capsule    Sig: Take 1 capsule (30 mg total) by mouth every morning.    Dispense:  30 capsule    Refill:  0    No follow-ups on file.    This visit occurred during the SARS-CoV-2 public health emergency.  Safety protocols were in place, including screening questions prior to the visit, additional usage of staff PPE, and extensive cleaning of exam room while observing appropriate contact time as indicated for disinfecting solutions.

## 2022-06-10 ENCOUNTER — Encounter: Payer: Self-pay | Admitting: Family Medicine

## 2022-06-10 ENCOUNTER — Other Ambulatory Visit: Payer: Self-pay

## 2022-06-10 DIAGNOSIS — G609 Hereditary and idiopathic neuropathy, unspecified: Secondary | ICD-10-CM

## 2022-06-10 MED ORDER — PREGABALIN 75 MG PO CAPS: 75.0000 mg | ORAL_CAPSULE | Freq: Two times a day (BID) | ORAL | 0 refills | Status: DC

## 2022-06-16 ENCOUNTER — Ambulatory Visit: Payer: BC Managed Care – PPO | Admitting: Family Medicine

## 2022-08-05 ENCOUNTER — Other Ambulatory Visit: Payer: Self-pay | Admitting: Family Medicine

## 2022-08-05 DIAGNOSIS — E038 Other specified hypothyroidism: Secondary | ICD-10-CM

## 2022-08-05 DIAGNOSIS — J3089 Other allergic rhinitis: Secondary | ICD-10-CM

## 2022-08-05 NOTE — Telephone Encounter (Signed)
Pt moved to [place].  Tvt

## 2022-08-05 NOTE — Telephone Encounter (Signed)
Please contact the patient to schedule follow-up appt.

## 2022-10-05 ENCOUNTER — Encounter: Payer: Self-pay | Admitting: *Deleted

## 2022-12-19 LAB — TESTOSTERONE: Testosterone: 282

## 2022-12-19 LAB — HM HIV SCREENING LAB: HM HIV Screening: NEGATIVE

## 2022-12-19 LAB — PROTEIN / CREATININE RATIO, URINE: Albumin, U: 4.4

## 2022-12-19 LAB — HEPATITIS B SURFACE ANTIGEN

## 2023-07-28 ENCOUNTER — Encounter: Payer: Self-pay | Admitting: Family Medicine

## 2023-07-28 ENCOUNTER — Ambulatory Visit: Payer: BC Managed Care – PPO | Admitting: Family Medicine

## 2023-07-28 VITALS — BP 127/80 | HR 84 | Ht 70.0 in | Wt 363.0 lb

## 2023-07-28 DIAGNOSIS — J4520 Mild intermittent asthma, uncomplicated: Secondary | ICD-10-CM | POA: Diagnosis not present

## 2023-07-28 DIAGNOSIS — E039 Hypothyroidism, unspecified: Secondary | ICD-10-CM

## 2023-07-28 DIAGNOSIS — Z23 Encounter for immunization: Secondary | ICD-10-CM | POA: Diagnosis not present

## 2023-07-28 DIAGNOSIS — E291 Testicular hypofunction: Secondary | ICD-10-CM | POA: Diagnosis not present

## 2023-07-28 DIAGNOSIS — G609 Hereditary and idiopathic neuropathy, unspecified: Secondary | ICD-10-CM

## 2023-07-28 DIAGNOSIS — R058 Other specified cough: Secondary | ICD-10-CM

## 2023-07-28 DIAGNOSIS — R7989 Other specified abnormal findings of blood chemistry: Secondary | ICD-10-CM

## 2023-07-28 LAB — RPR TITER (REFLEX): RPR Titer: NONREACTIVE

## 2023-07-28 LAB — HEP A AB, TOTAL: Hep A Ab, Total: NEGATIVE

## 2023-07-28 LAB — HEPATITIS B SURFACE ANTIGEN: Hepatitis B Surface Ag: NEGATIVE

## 2023-07-28 LAB — HEPATITIS B CORE AB, IGM: HEPATITIS B CORE AB TOTAL (REFL): NEGATIVE

## 2023-07-28 LAB — HCV AB W REFLEX TO QUANT PCR: HCV Ab: NONREACTIVE

## 2023-07-28 MED ORDER — METHYLPHENIDATE HCL ER (CD) 30 MG PO CPCR: 30.0000 mg | ORAL_CAPSULE | ORAL | 0 refills | Status: DC

## 2023-07-28 MED ORDER — SYMBICORT 160-4.5 MCG/ACT IN AERO: 2.0000 | INHALATION_SPRAY | Freq: Two times a day (BID) | RESPIRATORY_TRACT | 3 refills | Status: DC

## 2023-07-28 MED ORDER — PREGABALIN 75 MG PO CAPS: 75.0000 mg | ORAL_CAPSULE | Freq: Two times a day (BID) | ORAL | 1 refills | Status: DC

## 2023-07-28 NOTE — Progress Notes (Signed)
 Garrett Watson - 48 y.o. male MRN [id number]  Date of birth: [birth year]  Subjective Chief Complaint  Patient presents with   Medical Management of Chronic Issues    HPI Garrett Watson is a 48 y.o. male here today for follow-up visit.  He recently moved back to the area from [place] .  Reports overall he is doing well.  Doing well with current medications.  Would like to restart methylphenidate .  He was switched to a dry powder inhaler while living in [place] .  Does not like this is that his Symbicort .  Would like to try switching back to Symbicort .  He does continue on Singulair .  He did have low testosterone  levels on labs.  He and his wife are trying to conceive.  He would like referral to endocrinology.  ROS:  A comprehensive ROS was completed and negative except as noted per HPI  Allergies  Allergen Reactions   Other Cough    Patient reports reaction was when he was a baby    Robitussin 12 Hour Cough [Dextromethorphan Polistirex Er] Hives   Phentermine  Other (See Comments)    Irritability, increased heart rate    Past Medical History:  Diagnosis Date   Allergy    Anxiety    Arthritis    osteoarthritis - left elbow   Asthma    Cervical stenosis of spine 2021   Erectile dysfunction    Former cigarette smoker    GERD (gastroesophageal reflux disease)    occassionally   Hypogonadism male    Hypokalemia    Neuromuscular disorder (HCC)    nerapathy   Obesity    Tendonitis    Thyroid disease     Past Surgical History:  Procedure Laterality Date   CERVICAL FUSION     C5- C6 Feb 8. 2022   KNEE ARTHROSCOPY Right    2002, 2018   left knee Left    aug,2022   OSTEOTOMY MAXILLARY     1995   REPLACEMENT TOTAL KNEE Right    nov 2022   WISDOM TOOTH EXTRACTION     x4    Social History   Socioeconomic History   Marital status: Single    Spouse name: Not on file   Number of children: Not on file   Years of education: Not on file   Highest  education level: Some college, no degree  Occupational History   Not on file  Tobacco Use   Smoking status: Former    Current packs/day: 0.00    Types: Cigarettes    Quit date: 08/12/2015    Years since quitting: 7.9    Passive exposure: Current   Smokeless tobacco: Never  Vaping Use   Vaping status: Every Day  Substance and Sexual Activity   Alcohol use: Yes    Comment: rarely   Drug use: Not Currently    Types: Marijuana    Comment: Uses CBD   Sexual activity: Not Currently  Other Topics Concern   Not on file  Social History Narrative   Not on file   Social Drivers of Health   Financial Resource Strain: Medium Risk (07/28/2023)   Overall Financial Resource Strain (CARDIA)    Difficulty of Paying Living Expenses: Somewhat hard  Food Insecurity: No Food Insecurity (07/28/2023)   Hunger Vital Sign    Worried About Running Out of Food in the Last Year: Never true    Ran Out of Food in the Last Year: Never true  Transportation Needs: No Transportation Needs (  07/28/2023)   PRAPARE - Administrator, Civil Service (Medical): No    Lack of Transportation (Non-Medical): No  Physical Activity: Insufficiently Active (07/28/2023)   Exercise Vital Sign    Days of Exercise per Week: 2 days    Minutes of Exercise per Session: 60 min  Stress: Stress Concern Present (07/28/2023)   [employer] of Occupational Health - Occupational Stress Questionnaire    Feeling of Stress : To some extent  Social Connections: Moderately Isolated (07/28/2023)   Social Connection and Isolation Panel [NHANES]    Frequency of Communication with Friends and Family: Three times a week    Frequency of Social Gatherings with Friends and Family: Once a week    Attends Religious Services: Never    Database Administrator or Organizations: No    Attends Engineer, Structural: Not on file    Marital Status: Living with partner    Family History  Problem Relation Age of Onset   Colon polyps  Mother    Alcohol abuse Father    Hyperlipidemia Father    Hypertension Father    Stroke Father    Alcohol abuse Brother    Alcohol abuse Brother    Stroke Paternal Aunt    Diabetes Paternal Aunt    Diabetes Paternal Uncle    Kidney cancer Maternal Grandmother    Lung cancer Maternal Grandfather    Diabetes Cousin    Depression Cousin    Colon cancer Neg Hx    Stomach cancer Neg Hx    Esophageal cancer Neg Hx    Rectal cancer Neg Hx    Crohn's disease Neg Hx     Health Maintenance  Topic Date Due   COVID-19 Vaccine (3 - 2024-25 season) 08/13/2023 (Originally 02/21/2023)   DTaP/Tdap/Td (2 - Td or Tdap) 12/05/2025   Colonoscopy  10/14/2028   Pneumococcal Vaccine 99-63 Years old  Completed   INFLUENZA VACCINE  Completed   Hepatitis C Screening  Completed   HIV Screening  Completed   HPV VACCINES  Aged Out     ----------------------------------------------------------------------------------------------------------------------------------------------------------------------------------------------------------------- Physical Exam BP 127/80 (BP Location: Left Arm, Patient Position: Sitting, Cuff Size: Large)   Pulse 84   Ht 5' 10 (1.778 m)   Wt (!) 363 lb (164.7 kg)   SpO2 96%   BMI 52.09 kg/m   Physical Exam Constitutional:      Appearance: Normal appearance.  HENT:     Head: Normocephalic and atraumatic.  Eyes:     General: No scleral icterus. Cardiovascular:     Rate and Rhythm: Normal rate and regular rhythm.  Pulmonary:     Effort: Pulmonary effort is normal.     Breath sounds: Normal breath sounds.  Musculoskeletal:     Cervical back: Neck supple.  Neurological:     Mental Status: He is alert.  Psychiatric:        Mood and Affect: Mood normal.        Behavior: Behavior normal.      ------------------------------------------------------------------------------------------------------------------------------------------------------------------------------------------------------------------- Assessment and Plan  Secondary male hypogonadism Referral placed to endocrinology as he has wife are trying to conceive.  Idiopathic peripheral neuropathy Diagnosed by neurology in 2012.  Remains on Lyrica  which works pretty well for him.  Hypothyroidism Reports recent TSH in [place]  within normal limits.  He will get me a copy of labs.  Mild intermittent asthma without complication Symbicort  seem to work better for him.  Will change back to this.  Continue Singulair .  Meds ordered this encounter  Medications   pregabalin  (LYRICA ) 75 MG capsule    Sig: Take 1 capsule (75 mg total) by mouth 2 (two) times daily.    Dispense:  180 capsule    Refill:  1    Not to exceed 5 additional fills before 09/06/2019   DISCONTD: SYMBICORT  160-4.5 MCG/ACT inhaler    Sig: Inhale 2 puffs into the lungs in the morning and at bedtime.    Dispense:  30.6 each    Refill:  3   methylphenidate  (METADATE  CD) 30 MG CR capsule    Sig: Take 1 capsule (30 mg total) by mouth every morning.    Dispense:  30 capsule    Refill:  0    No follow-ups on file.    This visit occurred during the SARS-CoV-2 public health emergency.  Safety protocols were in place, including screening questions prior to the visit, additional usage of staff PPE, and extensive cleaning of exam room while observing appropriate contact time as indicated for disinfecting solutions.

## 2023-07-30 MED ORDER — MOMETASONE FURO-FORMOTEROL FUM 100-5 MCG/ACT IN AERO
2.0000 | INHALATION_SPRAY | Freq: Two times a day (BID) | RESPIRATORY_TRACT | 3 refills | Status: DC
Start: 2023-07-30 — End: 2024-03-14

## 2023-08-01 NOTE — Assessment & Plan Note (Signed)
 Diagnosed by neurology in 2012.  Remains on Lyrica  which works pretty well for him.

## 2023-08-01 NOTE — Assessment & Plan Note (Signed)
 Referral placed to endocrinology as he has wife are trying to conceive.

## 2023-08-01 NOTE — Assessment & Plan Note (Signed)
 Reports recent TSH in [place]  within normal limits.  He will get me a copy of labs.

## 2023-08-01 NOTE — Assessment & Plan Note (Signed)
 Symbicort  seem to work better for him.  Will change back to this.  Continue Singulair .

## 2023-08-18 ENCOUNTER — Encounter: Payer: Self-pay | Admitting: Family Medicine

## 2023-08-18 ENCOUNTER — Ambulatory Visit (INDEPENDENT_AMBULATORY_CARE_PROVIDER_SITE_OTHER): Payer: BC Managed Care – PPO | Admitting: Family Medicine

## 2023-08-18 VITALS — BP 120/77 | HR 86 | Ht 70.0 in | Wt 364.0 lb

## 2023-08-18 DIAGNOSIS — Z3141 Encounter for fertility testing: Secondary | ICD-10-CM | POA: Diagnosis not present

## 2023-08-18 DIAGNOSIS — Z0000 Encounter for general adult medical examination without abnormal findings: Secondary | ICD-10-CM | POA: Diagnosis not present

## 2023-08-18 DIAGNOSIS — E039 Hypothyroidism, unspecified: Secondary | ICD-10-CM

## 2023-08-18 DIAGNOSIS — Z13220 Encounter for screening for lipoid disorders: Secondary | ICD-10-CM | POA: Diagnosis not present

## 2023-08-18 NOTE — Progress Notes (Signed)
 Garrett Watson - 48 y.o. male MRN [id number]  Date of birth: 08-Sep-1975  Subjective Chief Complaint  Patient presents with   Annual Exam    HPI Garrett Watson is a 48 y.o. male here today for annual exam.   He reports that he is doing well.  Needs semen testing for fertility clinic.     He is fairly active.  He feels that he is doing pretty well with his diet.   Doing well with current medications.  Change to [name] is working well for him.   He is a non-smoker. Occasional EtOH.   Review of Systems  Constitutional:  Negative for chills, fever, malaise/fatigue and weight loss.  HENT:  Negative for congestion, ear pain and sore throat.   Eyes:  Negative for blurred vision, double vision and pain.  Respiratory:  Negative for cough and shortness of breath.   Cardiovascular:  Negative for chest pain and palpitations.  Gastrointestinal:  Negative for abdominal pain, blood in stool, constipation, heartburn and nausea.  Genitourinary:  Negative for dysuria and urgency.  Musculoskeletal:  Negative for joint pain and myalgias.  Neurological:  Negative for dizziness and headaches.  Endo/Heme/Allergies:  Does not bruise/bleed easily.  Psychiatric/Behavioral:  Negative for depression. The patient is not nervous/anxious and does not have insomnia.     Allergies  Allergen Reactions   Other Cough    Patient reports reaction was when he was a baby    Robitussin 12 Hour Cough [Dextromethorphan Polistirex Er] Hives   Phentermine Other (See Comments)    Irritability, increased heart rate    Past Medical History:  Diagnosis Date   Allergy    Anxiety    Arthritis    osteoarthritis - left elbow   Asthma    Cervical stenosis of spine 2021   Erectile dysfunction    Former cigarette smoker    GERD (gastroesophageal reflux disease)    occassionally   Hypogonadism male    Hypokalemia    Neuromuscular disorder (HCC)    nerapathy   Obesity    Tendonitis    Thyroid  disease     Past Surgical History:  Procedure Laterality Date   CERVICAL FUSION     C5- C6 Feb 8. 2022   KNEE ARTHROSCOPY Right    2002, 2018   left knee Left    aug,2022   OSTEOTOMY MAXILLARY     1995   REPLACEMENT TOTAL KNEE Right    nov 2022   WISDOM TOOTH EXTRACTION     x4    Social History   Socioeconomic History   Marital status: Single    Spouse name: Not on file   Number of children: Not on file   Years of education: Not on file   Highest education level: Some college, no degree  Occupational History   Not on file  Tobacco Use   Smoking status: Former    Current packs/day: 0.00    Types: Cigarettes    Quit date: 08/12/2015    Years since quitting: 8.0    Passive exposure: Current   Smokeless tobacco: Never  Vaping Use   Vaping status: Every Day  Substance and Sexual Activity   Alcohol use: Yes    Comment: rarely   Drug use: Not Currently    Types: Marijuana    Comment: Uses CBD   Sexual activity: Not Currently  Other Topics Concern   Not on file  Social History Narrative   Not on file  Social Drivers of Health   Financial Resource Strain: Medium Risk (07/28/2023)   Overall Financial Resource Strain (CARDIA)    Difficulty of Paying Living Expenses: Somewhat hard  Food Insecurity: No Food Insecurity (07/28/2023)   Hunger Vital Sign    Worried About Running Out of Food in the Last Year: Never true    Ran Out of Food in the Last Year: Never true  Transportation Needs: No Transportation Needs (07/28/2023)   PRAPARE - Administrator, Civil Service (Medical): No    Lack of Transportation (Non-Medical): No  Physical Activity: Insufficiently Active (07/28/2023)   Exercise Vital Sign    Days of Exercise per Week: 2 days    Minutes of Exercise per Session: 60 min  Stress: Stress Concern Present (07/28/2023)   [employer] of Occupational Health - Occupational Stress Questionnaire    Feeling of Stress : To some extent  Social Connections:  Moderately Isolated (07/28/2023)   Social Connection and Isolation Panel [NHANES]    Frequency of Communication with Friends and Family: Three times a week    Frequency of Social Gatherings with Friends and Family: Once a week    Attends Religious Services: Never    Database administrator or Organizations: No    Attends Engineer, structural: Not on file    Marital Status: Living with partner    Family History  Problem Relation Age of Onset   Colon polyps Mother    Alcohol abuse Father    Hyperlipidemia Father    Hypertension Father    Stroke Father    Alcohol abuse Brother    Alcohol abuse Brother    Stroke Paternal Aunt    Diabetes Paternal Aunt    Diabetes Paternal Uncle    Kidney cancer Maternal Grandmother    Lung cancer Maternal Grandfather    Diabetes Cousin    Depression Cousin    Colon cancer Neg Hx    Stomach cancer Neg Hx    Esophageal cancer Neg Hx    Rectal cancer Neg Hx    Crohn's disease Neg Hx     Health Maintenance  Topic Date Due   COVID-19 Vaccine (3 - 2024-25 season) 02/15/2024 (Originally 02/21/2023)   DTaP/Tdap/Td (2 - Td or Tdap) 12/05/2025   Colonoscopy  10/14/2028   Pneumococcal Vaccine 55-63 Years old  Completed   INFLUENZA VACCINE  Completed   Hepatitis C Screening  Completed   HIV Screening  Completed   HPV VACCINES  Aged Out     ----------------------------------------------------------------------------------------------------------------------------------------------------------------------------------------------------------------- Physical Exam BP 120/77 (BP Location: Left Arm, Patient Position: Sitting, Cuff Size: Large)   Pulse 86   Ht 5\' 10"  (1.778 m)   Wt (!) 364 lb (165.1 kg)   SpO2 94%   BMI 52.23 kg/m   Physical Exam Constitutional:      General: He is not in acute distress. HENT:     Head: Normocephalic and atraumatic.     Right Ear: Tympanic membrane and external ear normal.     Left Ear: Tympanic membrane and  external ear normal.  Eyes:     General: No scleral icterus. Neck:     Thyroid: No thyromegaly.  Cardiovascular:     Rate and Rhythm: Normal rate and regular rhythm.     Heart sounds: Normal heart sounds.  Pulmonary:     Effort: Pulmonary effort is normal.     Breath sounds: Normal breath sounds.  Abdominal:     General: Bowel sounds are normal.  There is no distension.     Palpations: Abdomen is soft.     Tenderness: There is no abdominal tenderness. There is no guarding.  Musculoskeletal:     Cervical back: Normal range of motion.  Lymphadenopathy:     Cervical: No cervical adenopathy.  Skin:    General: Skin is warm and dry.     Findings: No rash.  Neurological:     Mental Status: He is alert and oriented to person, place, and time.     Cranial Nerves: No cranial nerve deficit.     Motor: No abnormal muscle tone.  Psychiatric:        Mood and Affect: Mood normal.        Behavior: Behavior normal.     ------------------------------------------------------------------------------------------------------------------------------------------------------------------------------------------------------------------- Assessment and Plan  Well adult exam Well adult Orders Placed This Encounter  Procedures   CMP14+EGFR   CBC with Differential/Platelet   Lipid Panel With LDL/HDL Ratio   TSH   Semen Analysis, Basic  Screenings: Per lab orders, referral to gastroenterology for colonoscopy. Immunizations: Up-to-date Anticipatory guidance/risk factor reduction: Recommendations per AVS.   No orders of the defined types were placed in this encounter.   No follow-ups on file.    This visit occurred during the SARS-CoV-2 public health emergency.  Safety protocols were in place, including screening questions prior to the visit, additional usage of staff PPE, and extensive cleaning of exam room while observing appropriate contact time as indicated for disinfecting solutions.

## 2023-08-18 NOTE — Assessment & Plan Note (Signed)
 Well adult Orders Placed This Encounter  Procedures   CMP14+EGFR   CBC with Differential/Platelet   Lipid Panel With LDL/HDL Ratio   TSH   Semen Analysis, Basic  Screenings: Per lab orders, referral to gastroenterology for colonoscopy. Immunizations: Up-to-date Anticipatory guidance/risk factor reduction: Recommendations per AVS.

## 2023-08-18 NOTE — Patient Instructions (Signed)

## 2023-10-05 ENCOUNTER — Encounter: Payer: Self-pay | Admitting: Family Medicine

## 2023-10-05 DIAGNOSIS — Z113 Encounter for screening for infections with a predominantly sexual mode of transmission: Secondary | ICD-10-CM

## 2023-10-08 LAB — CBC WITH DIFFERENTIAL/PLATELET
Basophils Absolute: 0.1 10*3/uL (ref 0.0–0.2)
Basos: 1 %
EOS (ABSOLUTE): 0.3 10*3/uL (ref 0.0–0.4)
Eos: 4 %
Hematocrit: 50 % (ref 37.5–51.0)
Hemoglobin: 17 g/dL (ref 13.0–17.7)
Immature Grans (Abs): 0 10*3/uL (ref 0.0–0.1)
Immature Granulocytes: 0 %
Lymphocytes Absolute: 1.8 10*3/uL (ref 0.7–3.1)
Lymphs: 27 %
MCH: 30.3 pg (ref 26.6–33.0)
MCHC: 34 g/dL (ref 31.5–35.7)
MCV: 89 fL (ref 79–97)
Monocytes Absolute: 0.5 10*3/uL (ref 0.1–0.9)
Monocytes: 7 %
Neutrophils Absolute: 3.9 10*3/uL (ref 1.4–7.0)
Neutrophils: 61 %
Platelets: 205 10*3/uL (ref 150–450)
RBC: 5.61 x10E6/uL (ref 4.14–5.80)
RDW: 13.3 % (ref 11.6–15.4)
WBC: 6.5 10*3/uL (ref 3.4–10.8)

## 2023-10-08 LAB — CMP14+EGFR
ALT: 43 IU/L (ref 0–44)
AST: 31 IU/L (ref 0–40)
Albumin: 4.3 g/dL (ref 4.1–5.1)
Alkaline Phosphatase: 60 IU/L (ref 44–121)
BUN/Creatinine Ratio: 13 (ref 9–20)
BUN: 12 mg/dL (ref 6–24)
Bilirubin Total: 0.7 mg/dL (ref 0.0–1.2)
CO2: 21 mmol/L (ref 20–29)
Calcium: 9.6 mg/dL (ref 8.7–10.2)
Chloride: 102 mmol/L (ref 96–106)
Creatinine, Ser: 0.91 mg/dL (ref 0.76–1.27)
Globulin, Total: 2.4 g/dL (ref 1.5–4.5)
Glucose: 97 mg/dL (ref 70–99)
Potassium: 4.1 mmol/L (ref 3.5–5.2)
Sodium: 139 mmol/L (ref 134–144)
Total Protein: 6.7 g/dL (ref 6.0–8.5)
eGFR: 105 mL/min/{1.73_m2} (ref >59)

## 2023-10-08 LAB — LIPID PANEL WITH LDL/HDL RATIO
Cholesterol, Total: 189 mg/dL (ref 100–199)
HDL: 42 mg/dL (ref >39)
LDL Chol Calc (NIH): 127 mg/dL — ABNORMAL HIGH (ref 0–99)
LDL/HDL Ratio: 3 ratio (ref 0.0–3.6)
Triglycerides: 113 mg/dL (ref 0–149)
VLDL Cholesterol Cal: 20 mg/dL (ref 5–40)

## 2023-10-08 LAB — TSH: TSH: 3.3 u[IU]/mL (ref 0.450–4.500)

## 2023-10-22 ENCOUNTER — Encounter: Payer: Self-pay | Admitting: Family Medicine

## 2023-11-09 ENCOUNTER — Encounter: Payer: Self-pay | Admitting: Family Medicine

## 2023-11-09 ENCOUNTER — Ambulatory Visit (INDEPENDENT_AMBULATORY_CARE_PROVIDER_SITE_OTHER): Admitting: Family Medicine

## 2023-11-09 VITALS — BP 121/82 | HR 81 | Ht 70.0 in | Wt 361.0 lb

## 2023-11-09 DIAGNOSIS — H6123 Impacted cerumen, bilateral: Secondary | ICD-10-CM

## 2023-11-09 DIAGNOSIS — H6120 Impacted cerumen, unspecified ear: Secondary | ICD-10-CM | POA: Insufficient documentation

## 2023-11-09 NOTE — Progress Notes (Signed)
 Pt states 3 week prior he fell hard face first onto concrete directly onto his nose. This caused facial swelling and his glasses to cut into his face. He did not seek medical attention. Didn't think his nose was broken. 2 week later started experiencing hearing pressure in his left ear. Estimates he has 10% hearing in left ear. Symptoms are spreading to right ear currently.

## 2023-11-09 NOTE — Assessment & Plan Note (Signed)
 Successful lavage today.  Tolerated well with good relief of symptoms.  He may use debrox prn to prevent this from occurring in the future.

## 2023-11-09 NOTE — Progress Notes (Signed)
 Garrett Watson - 48 y.o. male MRN [id number]  Date of birth: 31-Jul-1975  Subjective Chief Complaint  Patient presents with   Ear Fullness    HPI Garrett Watson is a 48 y.o. male here today with complaint of his ears feeling "clogged."  This mainly affects the L ear but is starting to have some fullness in the R ear.   He does report having a fall on his face a few weeks ago and had some mild swelling across the bridge of his nose that resolved with ice.  He did not seek medical care after this.  He has some mild dizziness but denies headaches, nausea or vomiting.  ROS:  A comprehensive ROS was completed and negative except as noted per HPI  Allergies  Allergen Reactions   Other Cough    Patient reports reaction was when he was a baby    Robitussin 12 Hour Cough [Dextromethorphan Polistirex Er] Hives   Phentermine  Other (See Comments)    Irritability, increased heart rate    Past Medical History:  Diagnosis Date   Allergy    Anxiety    Arthritis    osteoarthritis - left elbow   Asthma    Cervical stenosis of spine 2021   Erectile dysfunction    Former cigarette smoker    GERD (gastroesophageal reflux disease)    occassionally   Hypogonadism male    Hypokalemia    Neuromuscular disorder (HCC)    nerapathy   Obesity    Tendonitis    Thyroid disease     Past Surgical History:  Procedure Laterality Date   CERVICAL FUSION     C5- C6 Feb 8. 2022   KNEE ARTHROSCOPY Right    2002, 2018   left knee Left    aug,2022   OSTEOTOMY MAXILLARY     1995   REPLACEMENT TOTAL KNEE Right    nov 2022   WISDOM TOOTH EXTRACTION     x4    Social History   Socioeconomic History   Marital status: Single    Spouse name: Not on file   Number of children: Not on file   Years of education: Not on file   Highest education level: Some college, no degree  Occupational History   Not on file  Tobacco Use   Smoking status: Former    Current packs/day: 0.00    Types:  Cigarettes    Quit date: 08/12/2015    Years since quitting: 8.2    Passive exposure: Current   Smokeless tobacco: Never  Vaping Use   Vaping status: Every Day  Substance and Sexual Activity   Alcohol use: Yes    Comment: rarely   Drug use: Not Currently    Types: Marijuana    Comment: Uses CBD   Sexual activity: Not Currently  Other Topics Concern   Not on file  Social History Narrative   Not on file   Social Drivers of Health   Financial Resource Strain: Medium Risk (07/28/2023)   Overall Financial Resource Strain (CARDIA)    Difficulty of Paying Living Expenses: Somewhat hard  Food Insecurity: No Food Insecurity (07/28/2023)   Hunger Vital Sign    Worried About Running Out of Food in the Last Year: Never true    Ran Out of Food in the Last Year: Never true  Transportation Needs: No Transportation Needs (07/28/2023)   PRAPARE - Administrator, Civil Service (Medical): No    Lack of Transportation (Non-Medical):  No  Physical Activity: Insufficiently Active (07/28/2023)   Exercise Vital Sign    Days of Exercise per Week: 2 days    Minutes of Exercise per Session: 60 min  Stress: Stress Concern Present (07/28/2023)   [employer] of Occupational Health - Occupational Stress Questionnaire    Feeling of Stress : To some extent  Social Connections: Moderately Isolated (07/28/2023)   Social Connection and Isolation Panel [NHANES]    Frequency of Communication with Friends and Family: Three times a week    Frequency of Social Gatherings with Friends and Family: Once a week    Attends Religious Services: Never    Database administrator or Organizations: No    Attends Engineer, structural: Not on file    Marital Status: Living with partner    Family History  Problem Relation Age of Onset   Colon polyps Mother    Alcohol abuse Father    Hyperlipidemia Father    Hypertension Father    Stroke Father    Alcohol abuse Brother    Alcohol abuse Brother    Stroke  Paternal Aunt    Diabetes Paternal Aunt    Diabetes Paternal Uncle    Kidney cancer Maternal Grandmother    Lung cancer Maternal Grandfather    Diabetes Cousin    Depression Cousin    Colon cancer Neg Hx    Stomach cancer Neg Hx    Esophageal cancer Neg Hx    Rectal cancer Neg Hx    Crohn's disease Neg Hx     Health Maintenance  Topic Date Due   COVID-19 Vaccine (3 - 2024-25 season) 02/15/2024 (Originally 02/21/2023)   INFLUENZA VACCINE  01/21/2024   DTaP/Tdap/Td (2 - Td or Tdap) 12/05/2025   Colonoscopy  10/14/2028   Pneumococcal Vaccine 44-21 Years old  Completed   Hepatitis C Screening  Completed   HIV Screening  Completed   HPV VACCINES  Aged Out   Meningococcal B Vaccine  Aged Out     ----------------------------------------------------------------------------------------------------------------------------------------------------------------------------------------------------------------- Physical Exam BP 121/82 (BP Location: Left Arm, Patient Position: Sitting, Cuff Size: Large)   Pulse 81   Ht 5\' 10"  (1.778 m)   Wt (!) 361 lb (163.7 kg)   SpO2 97%   BMI 51.80 kg/m   Physical Exam Constitutional:      Appearance: Normal appearance.  HENT:     Ears:     Comments: Bilateral cerumen occlusion.  Re-examined after lavage.  TM's normal.   Eyes:     General: No scleral icterus. Musculoskeletal:     Cervical back: Neck supple.  Neurological:     Mental Status: He is alert.     ------------------------------------------------------------------------------------------------------------------------------------------------------------------------------------------------------------------- Assessment and Plan  Cerumen impaction Successful lavage today.  Tolerated well with good relief of symptoms.  He may use debrox prn to prevent this from occurring in the future.    No orders of the defined types were placed in this encounter.   No follow-ups on  file.

## 2023-11-09 NOTE — Patient Instructions (Signed)
Use Debrox drops as needed.

## 2023-12-06 ENCOUNTER — Ambulatory Visit: Admitting: Endocrinology

## 2024-01-21 ENCOUNTER — Other Ambulatory Visit: Payer: Self-pay | Admitting: Family Medicine

## 2024-01-21 DIAGNOSIS — G609 Hereditary and idiopathic neuropathy, unspecified: Secondary | ICD-10-CM

## 2024-01-25 ENCOUNTER — Encounter: Payer: Self-pay | Admitting: Family Medicine

## 2024-01-25 DIAGNOSIS — G609 Hereditary and idiopathic neuropathy, unspecified: Secondary | ICD-10-CM

## 2024-01-26 MED ORDER — PREGABALIN 75 MG PO CAPS: 75.0000 mg | ORAL_CAPSULE | Freq: Two times a day (BID) | ORAL | 0 refills | Status: AC

## 2024-01-26 NOTE — Telephone Encounter (Signed)
 Forwarding message to Zada Palin, NP covering Dr. Alvia Requesting rx rf of Lyrica  Last written 07/28/2023 Last OV 11/09/2023 Upcoming appt = none

## 2024-02-01 ENCOUNTER — Ambulatory Visit: Payer: Self-pay | Admitting: *Deleted

## 2024-02-01 NOTE — Telephone Encounter (Signed)
 Copied from CRM [id number]. Topic: Clinical - Red Word Triage >> Feb 01, 2024 10:08 AM Susanna ORN wrote: Red Word that prompted transfer to Nurse Triage: Patient calling to schedule appt with provider(Dr. Willo, since Dr. Alvia is out). States he had a fall a couple of months ago & landed on pavement & is now having issues with sinuses that may have contributed from fall. States he's also having issues with breathing from the fall as well. Patient states he's having twitching of the limbs that has gotten worse and he's currently fighting a cold that produces phlegm and had blood with it. Reason for Disposition  [1] Sinus pain (not just congestion) AND [2] fever    Several issues to be addressed  Coughing and chest pressure with coughing  Answer Assessment - Initial Assessment Questions 1. LOCATION: Where does it hurt?     I had a fall in April on a parking lot on a curb.   My nose hit the curb.  Not broken.   But I'm having trouble breathing from that left side of my nose since then. I have ADHD.   None of the medicatins are working.   I had a side effect from one of them.  I have twitching.   My foot will move by itself and is getting worse.  Recently past 4-5 days I feel run down.   Hot and cold, lethargic.   No runny nose or anything.   This morning I'm coughing but nothing is coming out.   Then I got red particles coming out. 2. ONSET: When did the sinus pain start?  (e.g., hours, days)      Recently all these things have gotten. 3. SEVERITY: How bad is the pain?   (Scale 0-10; or none, mild, moderate or severe)     I feel pressure in my chest from chest congestion and coughing so hard this morning.   I use to take Symbicort  but my insurance changed so I'm on a different inhaler.   The last 3-4 days I feel it since I'm congested and not feeling well.   I have asthma. 4. RECURRENT SYMPTOM: Have you ever had sinus problems before? If Yes, ask: When was the last time? and What happened  that time?      Yes 5. NASAL CONGESTION: Is the nose blocked? If Yes, ask: Can you open it or must you breathe through your mouth?     Nothing is coming out 6. NASAL DISCHARGE: Do you have discharge from your nose? If so ask, What color?     No runny nose 7. FEVER: Do you have a fever? If Yes, ask: What is it, how was it measured, and when did it start?      Don't think so 8. OTHER SYMPTOMS: Do you have any other symptoms? (e.g., sore throat, cough, earache, difficulty breathing)     See above 9. PREGNANCY: Is there any chance you are pregnant? When was your last menstrual period?     N/A  Protocols used: Sinus Pain or Congestion-A-AH FYI Only or Action Required?: FYI only for provider.  Patient was last seen in primary care on 11/09/2023 by Alvia Bring, DO.  Called Nurse Triage reporting Sinusitis.and coughing.   Also wants to discuss ADHD medication side effects.    Symptoms began several days ago.  Interventions attempted: OTC medications: Mucinex.  Symptoms are: gradually worsening.  Triage Disposition: See Physician Within 24 Hours  Patient/caregiver understands and will follow disposition?:  Yes

## 2024-02-01 NOTE — Telephone Encounter (Signed)
 Patient scheduled for 02/03/2024 with Dr. Alvia

## 2024-02-03 ENCOUNTER — Ambulatory Visit (INDEPENDENT_AMBULATORY_CARE_PROVIDER_SITE_OTHER): Admitting: Family Medicine

## 2024-02-03 VITALS — BP 153/102 | HR 78 | Ht 70.0 in | Wt 352.0 lb

## 2024-02-03 DIAGNOSIS — R259 Unspecified abnormal involuntary movements: Secondary | ICD-10-CM | POA: Insufficient documentation

## 2024-02-03 DIAGNOSIS — J019 Acute sinusitis, unspecified: Secondary | ICD-10-CM | POA: Diagnosis not present

## 2024-02-03 MED ORDER — AMOXICILLIN-POT CLAVULANATE 875-125 MG PO TABS
1.0000 | ORAL_TABLET | Freq: Two times a day (BID) | ORAL | 0 refills | Status: AC
Start: 2024-02-03 — End: ?

## 2024-02-03 NOTE — Assessment & Plan Note (Signed)
 Unclear etiology.  Referral placed to neurology.

## 2024-02-03 NOTE — Progress Notes (Signed)
 Garrett Watson - 48 y.o. male MRN [id number]  Date of birth: 09/15/1975  Subjective Chief Complaint  Patient presents with   Cough    HPI Garrett Watson is a 48 y.o. male here today with complaint of sinus pressure, cough and congestion.  Symptoms started  about 4 days ago.   He did cough up a little blood yesterday but this has improved.  Denies dyspnea but does have some chest pressure.   He denies fever, chills, nausea, vomiting, diarrhea. Using OTC medications with limited relief.    He continues to note involuntary movements of the tongue and random twitching.  This seemed to be worsened by stimulants for ADHD but have persisted even with stopping medication.  He would like referral to neurology.   ROS:  A comprehensive ROS was completed and negative except as noted per HPI  Allergies  Allergen Reactions   Other Cough    Patient reports reaction was when he was a baby    Robitussin 12 Hour Cough [Dextromethorphan Polistirex Er] Hives   Phentermine  Other (See Comments)    Irritability, increased heart rate    Past Medical History:  Diagnosis Date   Allergy    Anxiety    Arthritis    osteoarthritis - left elbow   Asthma    Cervical stenosis of spine 2021   Erectile dysfunction    Former cigarette smoker    GERD (gastroesophageal reflux disease)    occassionally   Hypogonadism male    Hypokalemia    Neuromuscular disorder (HCC)    nerapathy   Obesity    Tendonitis    Thyroid disease     Past Surgical History:  Procedure Laterality Date   CERVICAL FUSION     C5- C6 Feb 8. 2022   KNEE ARTHROSCOPY Right    2002, 2018   left knee Left    aug,2022   OSTEOTOMY MAXILLARY     1995   REPLACEMENT TOTAL KNEE Right    nov 2022   WISDOM TOOTH EXTRACTION     x4    Social History   Socioeconomic History   Marital status: Single    Spouse name: Not on file   Number of children: Not on file   Years of education: Not on file   Highest education  level: Some college, no degree  Occupational History   Not on file  Tobacco Use   Smoking status: Former    Current packs/day: 0.00    Types: Cigarettes    Quit date: 08/12/2015    Years since quitting: 8.4    Passive exposure: Current   Smokeless tobacco: Never  Vaping Use   Vaping status: Every Day  Substance and Sexual Activity   Alcohol use: Yes    Comment: rarely   Drug use: Not Currently    Types: Marijuana    Comment: Uses CBD   Sexual activity: Not Currently  Other Topics Concern   Not on file  Social History Narrative   Not on file   Social Drivers of Health   Financial Resource Strain: Low Risk  (02/03/2024)   Overall Financial Resource Strain (CARDIA)    Difficulty of Paying Living Expenses: Not very hard  Food Insecurity: No Food Insecurity (02/03/2024)   Hunger Vital Sign    Worried About Running Out of Food in the Last Year: Never true    Ran Out of Food in the Last Year: Never true  Transportation Needs: No Transportation Needs (02/03/2024)  PRAPARE - Administrator, Civil Service (Medical): No    Lack of Transportation (Non-Medical): No  Physical Activity: Sufficiently Active (02/03/2024)   Exercise Vital Sign    Days of Exercise per Week: 4 days    Minutes of Exercise per Session: 50 min  Stress: Stress Concern Present (02/03/2024)   [employer] of Occupational Health - Occupational Stress Questionnaire    Feeling of Stress: Rather much  Social Connections: Moderately Isolated (02/03/2024)   Social Connection and Isolation Panel    Frequency of Communication with Friends and Family: Three times a week    Frequency of Social Gatherings with Friends and Family: Once a week    Attends Religious Services: Never    Database administrator or Organizations: No    Attends Engineer, structural: Not on file    Marital Status: Living with partner    Family History  Problem Relation Age of Onset   Colon polyps Mother    Alcohol abuse  Father    Hyperlipidemia Father    Hypertension Father    Stroke Father    Alcohol abuse Brother    Alcohol abuse Brother    Stroke Paternal Aunt    Diabetes Paternal Aunt    Diabetes Paternal Uncle    Kidney cancer Maternal Grandmother    Lung cancer Maternal Grandfather    Diabetes Cousin    Depression Cousin    Colon cancer Neg Hx    Stomach cancer Neg Hx    Esophageal cancer Neg Hx    Rectal cancer Neg Hx    Crohn's disease Neg Hx     Health Maintenance  Topic Date Due   Hepatitis B Vaccines 19-59 Average Risk (1 of 3 - 19+ 3-dose series) Never done   INFLUENZA VACCINE  01/21/2024   COVID-19 Vaccine (3 - 2024-25 season) 02/15/2024 (Originally 02/21/2023)   DTaP/Tdap/Td (2 - Td or Tdap) 12/05/2025   Colonoscopy  10/14/2028   Pneumococcal Vaccine  Completed   Hepatitis C Screening  Completed   HIV Screening  Completed   HPV VACCINES  Aged Out   Meningococcal B Vaccine  Aged Out     ----------------------------------------------------------------------------------------------------------------------------------------------------------------------------------------------------------------- Physical Exam BP (!) 153/102   Pulse 78   Ht 5' 10 (1.778 m)   Wt (!) 352 lb (159.7 kg)   SpO2 90%   BMI 50.51 kg/m   Physical Exam Constitutional:      Appearance: Normal appearance.  HENT:     Right Ear: Tympanic membrane normal.     Left Ear: Tympanic membrane normal.     Nose:     Comments: TTP along L maxiillary sinus  Cardiovascular:     Rate and Rhythm: Normal rate and regular rhythm.  Pulmonary:     Effort: Pulmonary effort is normal.     Breath sounds: Normal breath sounds.  Musculoskeletal:     Cervical back: Neck supple.  Lymphadenopathy:     Cervical: No cervical adenopathy.  Neurological:     Mental Status: He is alert.  Psychiatric:        Mood and Affect: Mood normal.        Behavior: Behavior normal.      ------------------------------------------------------------------------------------------------------------------------------------------------------------------------------------------------------------------- Assessment and Plan  Acute sinusitis Recommend increased fluid intake.  Adding augmentin .  Follow up if symptoms are not improving.   Involuntary movements Unclear etiology.  Referral placed to neurology.    Meds ordered this encounter  Medications   amoxicillin -clavulanate (AUGMENTIN ) 875-125 MG tablet  Sig: Take 1 tablet by mouth 2 (two) times daily.    Dispense:  20 tablet    Refill:  0    No follow-ups on file.

## 2024-02-03 NOTE — Assessment & Plan Note (Signed)
 Recommend increased fluid intake.  Adding augmentin .  Follow up if symptoms are not improving.

## 2024-02-22 ENCOUNTER — Encounter: Payer: Self-pay | Admitting: Sports Medicine

## 2024-03-13 ENCOUNTER — Other Ambulatory Visit: Payer: Self-pay | Admitting: Family Medicine

## 2024-03-16 ENCOUNTER — Ambulatory Visit: Admitting: Endocrinology

## 2024-04-20 ENCOUNTER — Encounter: Payer: Self-pay | Admitting: Family Medicine

## 2024-04-20 DIAGNOSIS — E038 Other specified hypothyroidism: Secondary | ICD-10-CM

## 2024-04-20 DIAGNOSIS — J3089 Other allergic rhinitis: Secondary | ICD-10-CM

## 2024-04-21 MED ORDER — MONTELUKAST SODIUM 10 MG PO TABS: ORAL_TABLET | ORAL | 1 refills | Status: AC

## 2024-04-21 MED ORDER — LEVOTHYROXINE SODIUM 25 MCG PO TABS
ORAL_TABLET | ORAL | 1 refills | Status: AC
Start: 2024-04-21 — End: ?

## 2024-05-03 ENCOUNTER — Ambulatory Visit: Admitting: Endocrinology

## 2024-07-01 ENCOUNTER — Encounter: Payer: Self-pay | Admitting: Family Medicine

## 2024-07-03 ENCOUNTER — Encounter: Payer: Self-pay | Admitting: Neurology

## 2024-07-03 ENCOUNTER — Ambulatory Visit: Payer: Self-pay | Admitting: Neurology

## 2024-07-03 ENCOUNTER — Encounter: Payer: Self-pay | Admitting: Family Medicine

## 2024-07-03 DIAGNOSIS — R259 Unspecified abnormal involuntary movements: Secondary | ICD-10-CM

## 2024-07-04 MED ORDER — BUDESONIDE-FORMOTEROL FUMARATE 160-4.5 MCG/ACT IN AERO: 2.0000 | INHALATION_SPRAY | Freq: Two times a day (BID) | RESPIRATORY_TRACT | 3 refills | Status: AC

## 2024-07-07 NOTE — Addendum Note (Signed)
 Addended by: Rayden Dock E on: 07/07/2024 12:16 PM   Modules accepted: Orders

## 2024-07-12 ENCOUNTER — Encounter: Payer: Self-pay | Admitting: Family Medicine

## 2024-07-12 ENCOUNTER — Ambulatory Visit: Admitting: Endocrinology

## 2024-07-12 DIAGNOSIS — E039 Hypothyroidism, unspecified: Secondary | ICD-10-CM

## 2024-07-12 DIAGNOSIS — E291 Testicular hypofunction: Secondary | ICD-10-CM

## 2024-07-12 NOTE — Telephone Encounter (Signed)
 I did not see that we had sent an endocrinology referral for this patient.  There is a message in his chart from [facility] wanting to change his appt time.   Referral is pended

## 2024-07-26 ENCOUNTER — Other Ambulatory Visit: Payer: Self-pay | Admitting: Family Medicine

## 2024-07-26 DIAGNOSIS — G609 Hereditary and idiopathic neuropathy, unspecified: Secondary | ICD-10-CM

## 2024-07-28 ENCOUNTER — Encounter: Payer: Self-pay | Admitting: Family Medicine
